# Patient Record
Sex: Male | Born: 1965 | Race: White | Hispanic: No | Smoking: Current every day smoker
Health system: Southern US, Community
[De-identification: ages and names within clinical notes are randomized; demographics above are authoritative.]

## PROBLEM LIST (undated history)

## (undated) DIAGNOSIS — B1920 Unspecified viral hepatitis C without hepatic coma: Secondary | ICD-10-CM

## (undated) DIAGNOSIS — IMO0002 Reserved for concepts with insufficient information to code with codable children: Secondary | ICD-10-CM

## (undated) HISTORY — PX: BACK SURGERY: SHX140

## (undated) HISTORY — PX: SPINAL FUSION: SHX223

---

## 2003-07-30 ENCOUNTER — Ambulatory Visit (HOSPITAL_COMMUNITY): Admission: RE | Admit: 2003-07-30 | Discharge: 2003-07-30 | Payer: Self-pay | Admitting: Pulmonary Disease

## 2003-08-17 ENCOUNTER — Emergency Department (HOSPITAL_COMMUNITY): Admission: EM | Admit: 2003-08-17 | Discharge: 2003-08-17 | Payer: Self-pay | Admitting: Emergency Medicine

## 2003-08-21 ENCOUNTER — Emergency Department (HOSPITAL_COMMUNITY): Admission: EM | Admit: 2003-08-21 | Discharge: 2003-08-21 | Payer: Self-pay | Admitting: Emergency Medicine

## 2004-06-05 ENCOUNTER — Emergency Department (HOSPITAL_COMMUNITY): Admission: EM | Admit: 2004-06-05 | Discharge: 2004-06-05 | Payer: Self-pay | Admitting: Emergency Medicine

## 2004-06-06 ENCOUNTER — Observation Stay (HOSPITAL_COMMUNITY): Admission: EM | Admit: 2004-06-06 | Discharge: 2004-06-07 | Payer: Self-pay | Admitting: Emergency Medicine

## 2004-11-18 ENCOUNTER — Emergency Department (HOSPITAL_COMMUNITY): Admission: EM | Admit: 2004-11-18 | Discharge: 2004-11-18 | Payer: Self-pay | Admitting: *Deleted

## 2008-05-23 ENCOUNTER — Emergency Department (HOSPITAL_COMMUNITY): Admission: EM | Admit: 2008-05-23 | Discharge: 2008-05-23 | Payer: Self-pay | Admitting: Emergency Medicine

## 2010-12-19 NOTE — Op Note (Signed)
Hector [name], Hector [name]               ACCOUNT NO.:  [id number]   MEDICAL RECORD NO.:  [id number]          PATIENT TYPE:  OBV   LOCATION:  A307                          FACILITY:  [facility]   PHYSICIAN:  Dalia Heading, M.D.  DATE OF BIRTH:  17-Jun-1966   DATE OF PROCEDURE:  06/06/2004  DATE OF DISCHARGE:  06/07/2004                                 OPERATIVE REPORT   PREOPERATIVE DIAGNOSIS:  Perineal/perirectal abscess.   POSTOPERATIVE DIAGNOSIS:  Perineal/perirectal abscess.   PROCEDURE:  Incision and drainage of perirectal abscess.   SURGEON:  Dalia Heading, M.D.   ANESTHESIA:  General.   INDICATIONS:  The patient is a 45 year old white male who presents with  worsening perirectal infection extending up the perineum to the base of the  scrotum.  Initial incision and drainage was performed in the emergency room,  but the patient's condition has worsened.  The patient now comes to the  operating room for incision and drainage of the perirectal abscess.  The  risks and benefits of the procedure were fully explained to the patient, who  gave informed consent.   DESCRIPTION OF PROCEDURE:  The patient was placed in the lithotomy position  after induction of general anesthesia.  The perineum was prepped and draped  using the usual sterile technique with Betadine.  Surgical site confirmation  was performed.   A longitudinal incision was made at the base of the scrotum extending down  into the perineum towards the anus.  Anaerobic and aerobic cultures were  taken.  Purulent fluid was obtained.  The wound was irrigated with normal  saline.  Any necrotic tissue was debrided.  Betadine impregnated packing was  then placed into the wound.  No connection to the rectum was noted.   All tape and needle counts were correct at the end of the procedure.  The  patient was awakened and transferred to PACU in stable condition.   COMPLICATIONS:  None.   SPECIMEN:  Cultures.   BLOOD LOSS:   MinimalLoraine Leriche   MAJ/MEDQ  D:  06/07/2004  T:  06/07/2004  Job:  [id number]   cc:   Dalia Heading, M.D.  [address], [place]  [place] [address]  Fax: [phone]   Oneal Deputy. Juanetta Gosling, M.D.  [address]  [place]  [place] [address]  Fax: [phone]

## 2011-04-13 ENCOUNTER — Ambulatory Visit: Payer: Self-pay | Admitting: Gastroenterology

## 2011-04-13 ENCOUNTER — Telehealth: Payer: Self-pay | Admitting: Gastroenterology

## 2011-04-14 NOTE — Telephone Encounter (Signed)
Routed to provider

## 2011-04-22 ENCOUNTER — Other Ambulatory Visit (HOSPITAL_COMMUNITY): Payer: Self-pay | Admitting: Physical Medicine and Rehabilitation

## 2011-04-22 DIAGNOSIS — M545 Low back pain: Secondary | ICD-10-CM

## 2011-04-22 DIAGNOSIS — M546 Pain in thoracic spine: Secondary | ICD-10-CM

## 2011-04-22 DIAGNOSIS — R209 Unspecified disturbances of skin sensation: Secondary | ICD-10-CM

## 2011-04-22 DIAGNOSIS — M961 Postlaminectomy syndrome, not elsewhere classified: Secondary | ICD-10-CM

## 2011-04-22 DIAGNOSIS — M79609 Pain in unspecified limb: Secondary | ICD-10-CM

## 2011-04-22 DIAGNOSIS — M47817 Spondylosis without myelopathy or radiculopathy, lumbosacral region: Secondary | ICD-10-CM

## 2011-04-29 ENCOUNTER — Ambulatory Visit (HOSPITAL_COMMUNITY)
Admission: RE | Admit: 2011-04-29 | Discharge: 2011-04-29 | Disposition: A | Discharge status: Home / Self Care | Payer: Medicare Other | Source: Ambulatory Visit | Attending: Physical Medicine and Rehabilitation | Admitting: Physical Medicine and Rehabilitation

## 2011-04-29 DIAGNOSIS — M961 Postlaminectomy syndrome, not elsewhere classified: Secondary | ICD-10-CM

## 2011-04-29 DIAGNOSIS — M79609 Pain in unspecified limb: Secondary | ICD-10-CM

## 2011-04-29 DIAGNOSIS — R209 Unspecified disturbances of skin sensation: Secondary | ICD-10-CM

## 2011-04-29 DIAGNOSIS — M546 Pain in thoracic spine: Secondary | ICD-10-CM

## 2011-04-29 DIAGNOSIS — M545 Low back pain: Secondary | ICD-10-CM

## 2011-04-29 DIAGNOSIS — M47817 Spondylosis without myelopathy or radiculopathy, lumbosacral region: Secondary | ICD-10-CM

## 2011-07-01 NOTE — Telephone Encounter (Signed)
First no-show. Please offer f/u office visit, may send letter.

## 2011-07-06 ENCOUNTER — Encounter: Payer: Self-pay | Admitting: Gastroenterology

## 2011-07-06 NOTE — Telephone Encounter (Signed)
Letter sent to patient.

## 2011-08-11 ENCOUNTER — Emergency Department (HOSPITAL_COMMUNITY)
Admission: EM | Admit: 2011-08-11 | Discharge: 2011-08-11 | Discharge status: Against Medical Advice | Payer: Medicare Other | Attending: Emergency Medicine | Admitting: Emergency Medicine

## 2011-08-11 ENCOUNTER — Encounter: Payer: Self-pay | Admitting: *Deleted

## 2011-08-11 DIAGNOSIS — Z0389 Encounter for observation for other suspected diseases and conditions ruled out: Secondary | ICD-10-CM | POA: Insufficient documentation

## 2011-08-11 DIAGNOSIS — Z5321 Procedure and treatment not carried out due to patient leaving prior to being seen by health care provider: Secondary | ICD-10-CM

## 2011-08-11 NOTE — ED Notes (Signed)
Pt lwbs 

## 2011-08-11 NOTE — ED Notes (Signed)
Abscess to lt forearm 

## 2013-01-09 ENCOUNTER — Encounter (HOSPITAL_COMMUNITY): Payer: Self-pay

## 2013-01-09 ENCOUNTER — Emergency Department (HOSPITAL_COMMUNITY)
Admission: EM | Admit: 2013-01-09 | Discharge: 2013-01-09 | Disposition: A | Discharge status: Home / Self Care | Payer: Medicare Other | Attending: Emergency Medicine | Admitting: Emergency Medicine

## 2013-01-09 DIAGNOSIS — M542 Cervicalgia: Secondary | ICD-10-CM | POA: Insufficient documentation

## 2013-01-09 DIAGNOSIS — F17200 Nicotine dependence, unspecified, uncomplicated: Secondary | ICD-10-CM | POA: Insufficient documentation

## 2013-01-09 DIAGNOSIS — G8929 Other chronic pain: Secondary | ICD-10-CM | POA: Insufficient documentation

## 2013-01-09 DIAGNOSIS — Z8619 Personal history of other infectious and parasitic diseases: Secondary | ICD-10-CM | POA: Insufficient documentation

## 2013-01-09 DIAGNOSIS — M549 Dorsalgia, unspecified: Secondary | ICD-10-CM | POA: Insufficient documentation

## 2013-01-09 DIAGNOSIS — Z8739 Personal history of other diseases of the musculoskeletal system and connective tissue: Secondary | ICD-10-CM | POA: Insufficient documentation

## 2013-01-09 HISTORY — DX: Reserved for concepts with insufficient information to code with codable children: IMO0002

## 2013-01-09 HISTORY — DX: Unspecified viral hepatitis C without hepatic coma: B19.20

## 2013-01-09 MED ORDER — ACETAMINOPHEN-CODEINE #3 300-30 MG PO TABS: 1.0000 | ORAL_TABLET | Freq: Four times a day (QID) | ORAL | Status: DC | PRN

## 2013-01-09 MED ORDER — METHOCARBAMOL 500 MG PO TABS: 500.0000 mg | ORAL_TABLET | Freq: Three times a day (TID) | ORAL | Status: DC

## 2013-01-09 MED ORDER — PREDNISONE 50 MG PO TABS
60.0000 mg | ORAL_TABLET | Freq: Once | ORAL | Status: AC
  Administered 2013-01-09: 60 mg via ORAL
  Filled 2013-01-09: qty 1

## 2013-01-09 MED ORDER — PREDNISONE 10 MG PO TABS: ORAL_TABLET | ORAL | Status: DC

## 2013-01-09 MED ORDER — METHOCARBAMOL 500 MG PO TABS
1000.0000 mg | ORAL_TABLET | Freq: Once | ORAL | Status: AC
  Administered 2013-01-09: 1000 mg via ORAL
  Filled 2013-01-09: qty 2

## 2013-01-09 MED ORDER — ACETAMINOPHEN-CODEINE #3 300-30 MG PO TABS
1.0000 | ORAL_TABLET | Freq: Once | ORAL | Status: AC
  Administered 2013-01-09: 1 via ORAL
  Filled 2013-01-09: qty 1

## 2013-01-09 MED ORDER — ONDANSETRON HCL 4 MG PO TABS
4.0000 mg | ORAL_TABLET | Freq: Once | ORAL | Status: AC
  Administered 2013-01-09: 4 mg via ORAL
  Filled 2013-01-09: qty 1

## 2013-01-09 NOTE — ED Provider Notes (Signed)
History     CSN: [id number]  Arrival date & time 01/09/13  1603   First MD Initiated Contact with Patient 01/09/13 1832      Chief Complaint  Patient presents with  . Back Pain  . Neck Pain    (Consider location/radiation/quality/duration/timing/severity/associated sxs/prior treatment) Patient is a 47 y.o. male presenting with back pain and neck pain. The history is provided by the patient.  Back Pain Location:  Thoracic spine Quality:  Aching Radiates to:  Does not radiate Pain severity:  Moderate Pain is:  Same all the time Onset quality:  Gradual Duration: 20 years. Timing:  Intermittent Progression:  Worsening Chronicity:  Chronic Context comment:  Hx of DDD of the neck and upper back. No recent injury. Relieved by:  Nothing Worsened by:  Nothing tried Ineffective treatments:  None tried Associated symptoms: no abdominal pain, no bladder incontinence, no bowel incontinence, no chest pain, no dysuria and no perianal numbness   Neck Pain Associated symptoms: no bladder incontinence, no bowel incontinence, no chest pain and no photophobia     Past Medical History  Diagnosis Date  . Degenerative disc disease   . Hepatitis C     Past Surgical History  Procedure Laterality Date  . Back surgery    . Spinal fusion      Family History  Problem Relation Age of Onset  . Diabetes Mother   . Diabetes Father     History  Substance Use Topics  . Smoking status: Current Every Day Smoker  . Smokeless tobacco: Not on file  . Alcohol Use: No      Review of Systems  Constitutional: Negative for activity change.       All ROS Neg except as noted in HPI  HENT: Positive for neck pain. Negative for nosebleeds.   Eyes: Negative for photophobia and discharge.  Respiratory: Negative for cough, shortness of breath and wheezing.   Cardiovascular: Negative for chest pain and palpitations.  Gastrointestinal: Negative for abdominal pain, blood in stool and bowel  incontinence.  Genitourinary: Negative for bladder incontinence, dysuria, frequency and hematuria.  Musculoskeletal: Positive for back pain. Negative for arthralgias.  Skin: Negative.   Neurological: Negative for dizziness, seizures and speech difficulty.  Psychiatric/Behavioral: Negative for hallucinations and confusion.    Allergies  Hydrocodone  Home Medications  No current outpatient prescriptions on file.  BP 164/82  Pulse 70  Temp(Src) 98.2 F (36.8 C) (Oral)  Resp 18  Ht 5\' 9"  (1.753 m)  Wt 200 lb (90.719 kg)  BMI 29.52 kg/m2  SpO2 100%  Physical Exam  Nursing note and vitals reviewed. Constitutional: He is oriented to person, place, and time. He appears well-developed and well-nourished.  Non-toxic appearance.  HENT:  Head: Normocephalic.  Right Ear: Tympanic membrane and external ear normal.  Left Ear: Tympanic membrane and external ear normal.  Eyes: EOM and lids are normal. Pupils are equal, round, and reactive to light.  Neck: Normal range of motion. Neck supple. Carotid bruit is not present.  Cardiovascular: Normal rate, regular rhythm, normal heart sounds, intact distal pulses and normal pulses.   Pulmonary/Chest: Breath sounds normal. No respiratory distress.  Abdominal: Soft. Bowel sounds are normal. There is no tenderness. There is no guarding.  Musculoskeletal: Normal range of motion.       Back:  Lymphadenopathy:       Head (right side): No submandibular adenopathy present.       Head (left side): No submandibular adenopathy present.  He has no cervical adenopathy.  Neurological: He is alert and oriented to person, place, and time. He has normal strength. No cranial nerve deficit or sensory deficit.  Skin: Skin is warm and dry.  Psychiatric: He has a normal mood and affect. His speech is normal.    ED Course  Procedures (including critical care time)  Labs Reviewed - No data to display No results found.   No diagnosis found.  Pulse ox 100%  on room air. WNL by my interpretation.  MDM  I have reviewed nursing notes, vital signs, and all appropriate lab and imaging results for this patient. Patient has history of degenerative disc disease of multiple sites. He has chronic neck and upper back area pain. He describes an acute flare of this chronic pain that started on last evening and continues today. Patient complains of pain with movement of the neck. The patient is not dropping any objects, there no gross neurologic deficits appreciated on examination today. The vital signs are stable with the exception of the blood pressure being elevated at 164/82. The gait is steady.  The plan at this time is for the patient to be treated with prednisone taper, Robaxin 3 times daily, Tylenol with codeine #3, 15 tablets. Referral to orthopedics for evaluation, as the patient has not seen an orthopedic specialist in over a year.  Kathie Dike, PA-C 01/09/13 1910

## 2013-01-09 NOTE — ED Notes (Signed)
Pt reports chronic neck and upper back pain.  Pt says pain has flared up today.

## 2013-01-09 NOTE — ED Notes (Signed)
Pain upper T spine and c spine , onset today, No recent injury, Says it hurts all the time, but is worse today.

## 2013-01-12 NOTE — ED Provider Notes (Signed)
Medical screening examination/treatment/procedure(s) were performed by non-physician practitioner and as supervising physician I was immediately available for consultation/collaboration.   Laray Anger, DO 01/12/13 [phone]

## 2013-07-02 ENCOUNTER — Emergency Department (HOSPITAL_COMMUNITY): Payer: Medicare Other

## 2013-07-02 ENCOUNTER — Emergency Department (HOSPITAL_COMMUNITY)
Admission: EM | Admit: 2013-07-02 | Discharge: 2013-07-03 | Disposition: A | Discharge status: Home / Self Care | Payer: Medicare Other | Attending: Emergency Medicine | Admitting: Emergency Medicine

## 2013-07-02 ENCOUNTER — Encounter (HOSPITAL_COMMUNITY): Payer: Self-pay | Admitting: Emergency Medicine

## 2013-07-02 DIAGNOSIS — F17200 Nicotine dependence, unspecified, uncomplicated: Secondary | ICD-10-CM | POA: Insufficient documentation

## 2013-07-02 DIAGNOSIS — Y939 Activity, unspecified: Secondary | ICD-10-CM | POA: Insufficient documentation

## 2013-07-02 DIAGNOSIS — W540XXA Bitten by dog, initial encounter: Secondary | ICD-10-CM | POA: Insufficient documentation

## 2013-07-02 DIAGNOSIS — Z8739 Personal history of other diseases of the musculoskeletal system and connective tissue: Secondary | ICD-10-CM | POA: Insufficient documentation

## 2013-07-02 DIAGNOSIS — S61209A Unspecified open wound of unspecified finger without damage to nail, initial encounter: Secondary | ICD-10-CM | POA: Insufficient documentation

## 2013-07-02 DIAGNOSIS — Y929 Unspecified place or not applicable: Secondary | ICD-10-CM | POA: Insufficient documentation

## 2013-07-02 DIAGNOSIS — Z203 Contact with and (suspected) exposure to rabies: Secondary | ICD-10-CM

## 2013-07-02 DIAGNOSIS — Z8619 Personal history of other infectious and parasitic diseases: Secondary | ICD-10-CM | POA: Insufficient documentation

## 2013-07-02 DIAGNOSIS — Z23 Encounter for immunization: Secondary | ICD-10-CM | POA: Insufficient documentation

## 2013-07-02 MED ORDER — OXYCODONE-ACETAMINOPHEN 5-325 MG PO TABS
2.0000 | ORAL_TABLET | Freq: Once | ORAL | Status: AC
  Administered 2013-07-02: 2 via ORAL
  Filled 2013-07-02: qty 2

## 2013-07-02 MED ORDER — TETANUS-DIPHTH-ACELL PERTUSSIS 5-2.5-18.5 LF-MCG/0.5 IM SUSP
0.5000 mL | Freq: Once | INTRAMUSCULAR | Status: AC
  Administered 2013-07-02: 0.5 mL via INTRAMUSCULAR
  Filled 2013-07-02: qty 0.5

## 2013-07-02 MED ORDER — RABIES VACCINE, PCEC IM SUSR
1.0000 mL | Freq: Once | INTRAMUSCULAR | Status: AC
  Administered 2013-07-03: 1 mL via INTRAMUSCULAR
  Filled 2013-07-02: qty 1

## 2013-07-02 MED ORDER — IBUPROFEN 800 MG PO TABS
800.0000 mg | ORAL_TABLET | Freq: Once | ORAL | Status: AC
  Administered 2013-07-02: 800 mg via ORAL
  Filled 2013-07-02: qty 1

## 2013-07-02 MED ORDER — LIDOCAINE HCL (PF) 1 % IJ SOLN
5.0000 mL | Freq: Once | INTRAMUSCULAR | Status: AC
  Administered 2013-07-03: 5 mL
  Filled 2013-07-02: qty 5

## 2013-07-02 MED ORDER — RABIES IMMUNE GLOBULIN 150 UNIT/ML IM INJ
20.0000 [IU]/kg | INJECTION | Freq: Once | INTRAMUSCULAR | Status: AC
  Administered 2013-07-03: 1800 [IU] via INTRAMUSCULAR
  Filled 2013-07-02: qty 12

## 2013-07-02 MED ORDER — RABIES IMMUNE GLOBULIN 150 UNIT/ML IM INJ
INJECTION | INTRAMUSCULAR | Status: AC
  Administered 2013-07-03: 1800 [IU] via INTRAMUSCULAR
  Filled 2013-07-02: qty 12

## 2013-07-02 NOTE — ED Provider Notes (Signed)
CSN: [id number]     Arrival date & time 07/02/13  2155 History  This chart was scribed for Sunnie Nielsen, MD by Blanchard Kelch, ED Scribe. The patient was seen in room [facility]. Patient's care was started at 11:04 PM.    Chief Complaint  Patient presents with  . Animal Bite   Patient is a 47 y.o. male presenting with animal bite. The history is provided by the patient. No language interpreter was used.  Animal Bite Contact animal:  Dog Location:  Finger and hand Hand injury location:  L hand Finger injury location:  R thumb Time since incident:  1 hour Pain details:    Quality:  Sharp   Severity:  Severe   Timing:  Constant Incident location:  Outside Provoked: unprovoked   Notifications:  Animal control and law enforcement Animal's rabies vaccination status:  Not immunized Animal in possession: no   Tetanus status:  Unknown Associated symptoms: no fever     HPI Comments: Hector [name] is a 47 y.o. male who presents to the Emergency Department complaining of unprovoked dog bites on his left hand and right thumb from a stray dog that occurred prior to arrival. he was walking down the road and the dog came out of bushes, bit him and then ran away. The animal control and police were notified but they do not have the dog in possession currently. He does not know if the dog has had his rabies vaccination. The bleeding is currently controlled.  He is complaining of constant, sharp pain to the affected area. The pain is a 10/10 in severity currently. He does not know if he is up to date on his Tetanus vaccination. He has a past medical history of degenerative disc disease and hepatitis C.   Past Medical History  Diagnosis Date  . Degenerative disc disease   . Hepatitis C    Past Surgical History  Procedure Laterality Date  . Back surgery    . Spinal fusion     Family History  Problem Relation Age of Onset  . Diabetes Mother   . Diabetes Father    History  Substance Use  Topics  . Smoking status: Current Every Day Smoker  . Smokeless tobacco: Not on file  . Alcohol Use: No    Review of Systems  Constitutional: Negative for fever.  Musculoskeletal: Positive for arthralgias.  Skin: Positive for wound.  All other systems reviewed and are negative.    Allergies  Hydrocodone  Home Medications  No current outpatient prescriptions on file. Triage Vitals: BP 149/79  Pulse 89  Temp(Src) 98 F (36.7 C) (Oral)  Resp 20  Ht 5\' 9"  (1.753 m)  Wt 200 lb (90.719 kg)  BMI 29.52 kg/m2  SpO2 98%  Physical Exam  Nursing note and vitals reviewed. Constitutional: He is oriented to person, place, and time. He appears well-developed and well-nourished.  HENT:  Head: Normocephalic and atraumatic.  Eyes: Conjunctivae and EOM are normal. Pupils are equal, round, and reactive to light.  Neck: Normal range of motion. Neck supple.  Cardiovascular: Normal rate, regular rhythm and intact distal pulses.   Pulmonary/Chest: Effort normal. No stridor. No respiratory distress.  Abdominal: Soft.  Musculoskeletal: Normal range of motion.  No tenderness over proximal left forearm.  Neurological: He is alert and oriented to person, place, and time.  Skin: Skin is warm and dry.  Right hand irregular laceration at base of thumb nail bed. Two linear wounds. No active bleeding. Tenderness over  area of IP joint. Slight subungual hematoma. Left wrist has multiple puncture wounds on dorsum of wrist. 2 cm laceration to thenar eminence. Assocaited swelling and tenderness. Distally neurovascularly intact. Good capillary refill.     ED Course  Nail bed trephination Date/Time: 07/03/2013 1:58 AM Performed by: Sunnie Nielsen Authorized by: Sunnie Nielsen Consent: Verbal consent obtained. Risks and benefits: risks, benefits and alternatives were discussed Consent given by: patient Patient understanding: patient states understanding of the procedure being performed Patient consent: the  patient's understanding of the procedure matches consent given Procedure consent: procedure consent matches procedure scheduled Required items: required blood products, implants, devices, and special equipment available Patient identity confirmed: verbally with patient Time out: Immediately prior to procedure a "time out" was called to verify the correct patient, procedure, equipment, support staff and site/side marked as required. Preparation: Patient was prepped and draped in the usual sterile fashion. Local anesthesia used: yes Anesthesia: local infiltration Local anesthetic: lidocaine 1% without epinephrine Anesthetic total: 4 ml Patient tolerance: Patient tolerated the procedure well with no immediate complications. Comments: Right thumb digital block performed.  Nail bed trephination with 18-gauge needle - subungual hematoma drained    (including critical care time)  DIAGNOSTIC STUDIES: Oxygen Saturation is 98% on room air, normal by my interpretation.    COORDINATION OF CARE: 11:03 PM -Will order wound care, and right thumb and left wrist x-rays. Had at length discussion about rabies and vaccination and I highly recommended getting the vaccination. He was worried about the pain associated with the shots because he believed they would be in his stomach but I explained the process to him. He was then agreeable to receiving the vaccination.  Patient verbalizes understanding and agrees with treatment plan.   Labs Review Labs Reviewed - No data to display Imaging Review Dg Wrist Complete Left  07/03/2013   CLINICAL DATA:  Alleged dog bite with pain and lacerations.  EXAM: LEFT WRIST - COMPLETE 3+ VIEW  COMPARISON:  None.  FINDINGS: Mild degenerate changes of the radiocarpal joint. No acute fracture or dislocation. No radiopaque foreign object. Scaphoid intact. Suspect soft tissue swelling primarily volarly.  IMPRESSION: No acute osseous abnormality.   Electronically Signed   By: Jeronimo Greaves M.D.   On: 07/03/2013 01:22   Dg Finger Thumb Right  07/03/2013   CLINICAL DATA:  Alleged dog bite.  Pain.  Laceration.  EXAM: RIGHT THUMB 2+V  COMPARISON:  None.  FINDINGS: Osseous irregularity about the radial aspect of the intercarpal joint of the thumb is favored to be degenerative or possibly remote posttraumatic. No donor site identified.  No acute fracture or dislocation. No soft tissue gas or radiopaque foreign object. Degenerate changes are seen about the interphalangeal joint of the thumb as well as the 2nd metacarpal phalangeal joint.  IMPRESSION: No acute osseous abnormality.   Electronically Signed   By: Jeronimo Greaves M.D.   On: 07/03/2013 01:23   Animal control notified  Augmentin provided Wounds cleaned and irrigated extensively Sterile dressings placed Percocet for pain control Patient states understanding strict return precautions/ infection precautions Plan recheck 48 hours Rabies immunoglobulin and vaccine initiated  Patient states understanding all discharge and followup instructions - he is able to afford prescription for Augmentin. Short course of Percocet provided MDM  Diagnosis: Dog bite right thumb, left wrist and hand  Evaluated with x-rays reviewed as above no bony injuries or foreign bodies Pain medications provided ABx provided Vital signs nurse's notes reviewed and considered  I personally  performed the services described in this documentation, which was scribed in my presence. The recorded information has been reviewed and is accurate.     Sunnie Nielsen, MD 07/03/13 [phone]

## 2013-07-02 NOTE — ED Notes (Addendum)
Patient reports stray dog bit both hands tonight. Patient unaware of who dog belonged to and has not reported incident. Puncture wounds to hands bilaterally and to lower left forearm. Bleeding controlled at this time.

## 2013-07-02 NOTE — ED Notes (Signed)
Patient states that a stray unknown dog bit him. Bite marks to the right thumb, left hand and left arm. Patient states that he went home and washed up and it would not quit bleeding so he came to the ER. Had patient wash his hand and arms at the sink in the room and pat dry with a towel. Patient informed that he may need Rabies shots. Patient states that he will get a tetanus shot but refuses to get the rabies shots.

## 2013-07-03 MED ORDER — OXYCODONE-ACETAMINOPHEN 5-325 MG PO TABS: 2.0000 | ORAL_TABLET | ORAL | Status: DC | PRN

## 2013-07-03 MED ORDER — AMOXICILLIN-POT CLAVULANATE 875-125 MG PO TABS: 1.0000 | ORAL_TABLET | Freq: Two times a day (BID) | ORAL | Status: DC

## 2013-07-03 MED ORDER — AMOXICILLIN-POT CLAVULANATE 875-125 MG PO TABS
1.0000 | ORAL_TABLET | Freq: Once | ORAL | Status: AC
  Administered 2013-07-03: 1 via ORAL
  Filled 2013-07-03: qty 1

## 2014-03-11 ENCOUNTER — Emergency Department (HOSPITAL_COMMUNITY)
Admission: EM | Admit: 2014-03-11 | Discharge: 2014-03-11 | Disposition: A | Discharge status: Home / Self Care | Payer: Medicare Other | Attending: Emergency Medicine | Admitting: Emergency Medicine

## 2014-03-11 ENCOUNTER — Emergency Department (HOSPITAL_COMMUNITY): Payer: Medicare Other

## 2014-03-11 ENCOUNTER — Encounter (HOSPITAL_COMMUNITY): Payer: Self-pay | Admitting: Emergency Medicine

## 2014-03-11 DIAGNOSIS — Y9289 Other specified places as the place of occurrence of the external cause: Secondary | ICD-10-CM | POA: Insufficient documentation

## 2014-03-11 DIAGNOSIS — Y9389 Activity, other specified: Secondary | ICD-10-CM | POA: Diagnosis not present

## 2014-03-11 DIAGNOSIS — W010XXA Fall on same level from slipping, tripping and stumbling without subsequent striking against object, initial encounter: Secondary | ICD-10-CM | POA: Insufficient documentation

## 2014-03-11 DIAGNOSIS — IMO0002 Reserved for concepts with insufficient information to code with codable children: Secondary | ICD-10-CM | POA: Insufficient documentation

## 2014-03-11 DIAGNOSIS — Z8619 Personal history of other infectious and parasitic diseases: Secondary | ICD-10-CM | POA: Insufficient documentation

## 2014-03-11 DIAGNOSIS — S335XXA Sprain of ligaments of lumbar spine, initial encounter: Secondary | ICD-10-CM | POA: Insufficient documentation

## 2014-03-11 DIAGNOSIS — Z8739 Personal history of other diseases of the musculoskeletal system and connective tissue: Secondary | ICD-10-CM | POA: Diagnosis not present

## 2014-03-11 DIAGNOSIS — F17200 Nicotine dependence, unspecified, uncomplicated: Secondary | ICD-10-CM | POA: Diagnosis not present

## 2014-03-11 DIAGNOSIS — S39012A Strain of muscle, fascia and tendon of lower back, initial encounter: Secondary | ICD-10-CM

## 2014-03-11 DIAGNOSIS — Z9889 Other specified postprocedural states: Secondary | ICD-10-CM | POA: Diagnosis not present

## 2014-03-11 MED ORDER — HYDROMORPHONE HCL PF 1 MG/ML IJ SOLN
1.0000 mg | Freq: Once | INTRAMUSCULAR | Status: AC
  Administered 2014-03-11: 1 mg via INTRAMUSCULAR
  Filled 2014-03-11: qty 1

## 2014-03-11 MED ORDER — IBUPROFEN 600 MG PO TABS: 600.0000 mg | ORAL_TABLET | Freq: Four times a day (QID) | ORAL | Status: AC | PRN

## 2014-03-11 MED ORDER — OXYCODONE-ACETAMINOPHEN 5-325 MG PO TABS: 1.0000 | ORAL_TABLET | ORAL | Status: AC | PRN

## 2014-03-11 NOTE — ED Notes (Signed)
PT fell this morning onto right side and c/o lower back pain. PT has hx of back surgery.

## 2014-03-11 NOTE — ED Notes (Signed)
NAD noted at time of d/c.

## 2014-03-11 NOTE — Discharge Instructions (Signed)
Lumbosacral Strain Lumbosacral strain is a strain of any of the parts that make up your lumbosacral vertebrae. Your lumbosacral vertebrae are the bones that make up the lower third of your backbone. Your lumbosacral vertebrae are held together by muscles and tough, fibrous tissue (ligaments).  CAUSES  A sudden blow to your back can cause lumbosacral strain. Also, anything that causes an excessive stretch of the muscles in the low back can cause this strain. This is typically seen when people exert themselves strenuously, fall, lift heavy objects, bend, or crouch repeatedly. RISK FACTORS  Physically demanding work.  Participation in pushing or pulling sports or sports that require a sudden twist of the back (tennis, golf, baseball).  Weight lifting.  Excessive lower back curvature.  Forward-tilted pelvis.  Weak back or abdominal muscles or both.  Tight hamstrings. SIGNS AND SYMPTOMS  Lumbosacral strain may cause pain in the area of your injury or pain that moves (radiates) down your leg.  DIAGNOSIS Your health care provider can often diagnose lumbosacral strain through a physical exam. In some cases, you may need tests such as X-ray exams.  TREATMENT  Treatment for your lower back injury depends on many factors that your clinician will have to evaluate. However, most treatment will include the use of anti-inflammatory medicines. HOME CARE INSTRUCTIONS   Avoid hard physical activities (tennis, racquetball, waterskiing) if you are not in proper physical condition for it. This may aggravate or create problems.  If you have a back problem, avoid sports requiring sudden body movements. Swimming and walking are generally safer activities.  Maintain good posture.  Maintain a healthy weight.  For acute conditions, you may put ice on the injured area.  Put ice in a plastic bag.  Place a towel between your skin and the bag.  Leave the ice on for 20 minutes, 2-3 times a day.  When the  low back starts healing, stretching and strengthening exercises may be recommended. SEEK MEDICAL CARE IF:  Your back pain is getting worse.  You experience severe back pain not relieved with medicines. SEEK IMMEDIATE MEDICAL CARE IF:   You have numbness, tingling, weakness, or problems with the use of your arms or legs.  There is a change in bowel or bladder control.  You have increasing pain in any area of the body, including your belly (abdomen).  You notice shortness of breath, dizziness, or feel faint.  You feel sick to your stomach (nauseous), are throwing up (vomiting), or become sweaty.  You notice discoloration of your toes or legs, or your feet get very cold. MAKE SURE YOU:   Understand these instructions.  Will watch your condition.  Will get help right away if you are not doing well or get worse. Document Released: 04/29/2005 Document Revised: 07/25/2013 Document Reviewed: 03/08/2013 [facility] Patient Information 2015 [employer], [place]. This information is not intended to replace advice given to you by your health care provider. Make sure you discuss any questions you have with your health care provider.      Do not drive within 4 hours of taking oxycodone as this will make you drowsy.  Avoid lifting,  Bending,  Twisting or any other activity that worsens your pain over the next week.  Apply an  icepack  to your lower back for 10-15 minutes every 2 hours for the next 2 days if this does not make your pain worse.  You may then start using a heating pad on Tuesday 20 minutes several times daily.  You  should get rechecked if your symptoms are not better over the next 5 days,  Or you develop increased pain,  Weakness in your leg(s) or loss of bladder or bowel function - these are symptoms of a worse injury.  Your xrays are negative for acute injury today.

## 2014-03-11 NOTE — ED Provider Notes (Signed)
CSN: [id number]     Arrival date & time 03/11/14  1358 History  This chart was scribed for non-physician practitioner, Burgess AmorJulie Rachele Lamaster, PA-C,working with Vanetta MuldersScott Zackowski, MD by Karle PlumberJennifer Tensley, ED Scribe. This patient was seen in room APFT23/APFT23 and the patient's care was started at 3:36 PM.  Chief Complaint  Patient presents with  . Back Pain   Patient is a 48 y.o. male presenting with back pain. The history is provided by the patient. No language interpreter was used.  Back Pain Associated symptoms: no abdominal pain, no chest pain, no dysuria, no fever, no numbness and no weakness    HPI Comments:  Hector [name] is a 48 y.o. male with h/o spinal fusion who presents to the Emergency Department complaining of severe lower back pain that radiates down BLE secondary to tripping over his dog and falling a few hours ago. He reports some mild numbness to his left lateral lower leg since the trip, denies weakness. He states he fell on his forearms but denies pain or injury to the upper extremities. He denies taking any medications for the pain. He denies bowel or bladder incontinence or retention,  weakness of the lower extremities.  Pt states [place] back [place] was in [place] in [place]. He reports allergy to Hydrocodone with a reaction of itching.  Past Medical History  Diagnosis Date  . Degenerative disc disease   . Hepatitis C    Past Surgical History  Procedure Laterality Date  . Back surgery    . Spinal fusion     Family History  Problem Relation Age of Onset  . Diabetes Mother   . Diabetes Father    History  Substance Use Topics  . Smoking status: Current Every Day Smoker -- 0.50 packs/day    Types: Cigarettes  . Smokeless tobacco: Not on file  . Alcohol Use: No    Review of Systems  Constitutional: Negative for fever.  Respiratory: Negative for shortness of breath.   Cardiovascular: Negative for chest pain and leg swelling.  Gastrointestinal: Negative for abdominal pain,  constipation and abdominal distention.  Genitourinary: Negative for dysuria, urgency, frequency, flank pain and difficulty urinating.  Musculoskeletal: Positive for back pain. Negative for gait problem and joint swelling.  Skin: Negative for rash.  Neurological: Negative for weakness and numbness.    Allergies  Hydrocodone  Home Medications   Prior to Admission medications   Medication Sig Start Date End Date Taking? Authorizing Provider  ibuprofen (ADVIL,MOTRIN) 600 MG tablet Take 1 tablet (600 mg total) by mouth every 6 (six) hours as needed. 03/11/14   Burgess AmorJulie Annaleia Pence, PA-C  oxyCODONE-acetaminophen (PERCOCET/ROXICET) 5-325 MG per tablet Take 1 tablet by mouth every 4 (four) hours as needed. 03/11/14   Burgess AmorJulie Della Scrivener, PA-C   Triage Vitals: BP 158/101  Pulse 82  Temp(Src) 98 F (36.7 C) (Oral)  Resp 18  Ht 5\' 9"  (1.753 m)  Wt 185 lb (83.915 kg)  BMI 27.31 kg/m2  SpO2 100% Physical Exam  Nursing note and vitals reviewed. Constitutional: He appears well-developed and well-nourished.  HENT:  Head: Normocephalic.  Eyes: Conjunctivae are normal.  Neck: Normal range of motion. Neck supple.  Cardiovascular: Normal rate and intact distal pulses.   Pedal pulses normal.  Pulmonary/Chest: Effort normal.  Abdominal: Soft. Bowel sounds are normal. He exhibits no distension and no mass.  Musculoskeletal: Normal range of motion. He exhibits no edema.       Lumbar back: He exhibits tenderness. He exhibits no swelling, no  edema and no spasm.  Positive SLR of left leg.  Neurological: He is alert. He has normal strength. He displays no atrophy and no tremor. No sensory deficit. Gait normal.  Reflex Scores:      Patellar reflexes are 2+ on the right side and 2+ on the left side.      Achilles reflexes are 2+ on the right side and 2+ on the left side. No strength deficit noted in hip and knee flexor and extensor muscle groups.  Ankle flexion and extension intact.  Skin: Skin is warm and dry.   Psychiatric: He has a normal mood and affect.    ED Course  Procedures (including critical care time) DIAGNOSTIC STUDIES: Oxygen Saturation is 100% on RA, normal by my interpretation.   COORDINATION OF CARE: 3:42 PM- Will give pain medication prior to discharge. Will prescribe pain medication and refer to orthopedics. Pt verbalizes understanding and agrees to plan.  Medications  HYDROmorphone (DILAUDID) injection 1 mg (1 mg Intramuscular Given 03/11/14 1609)    Labs Review Labs Reviewed - No data to display  Imaging Review Dg Lumbar Spine Complete  03/11/2014   CLINICAL DATA:  Low back pain, fall  EXAM: LUMBAR SPINE - COMPLETE 4+ VIEW  COMPARISON:  None.  FINDINGS: Five lumbar type vertebral bodies.  Normal lumbar lordosis.  No evidence of fracture dislocation.  Body heights are maintained.  Moderate multilevel degenerative changes.  Prior PLIF with pedicle screws at L4, L5, and S2.  Visualized bony pelvis appears intact.  IMPRESSION: No fracture or dislocation is seen.  Moderate multilevel degenerative changes.  Postsurgical changes, as above.   Electronically Signed   By: Charline Bills M.D.   On: 03/11/2014 15:35     EKG Interpretation None      MDM   Final diagnoses:  Lumbar strain, initial encounter    Dilaudid IM injection given here (son driving home). He was prescribed oxycodone, ibuprofen. Patients labs and/or radiological studies were viewed and considered during the medical decision making and disposition process. Encouraged rest,  Ice tx x 2 days, can add heat on day 3.  F/u with ortho for recheck if sx persist or worsen.  No neuro deficit on exam or by history to suggest emergent or surgical presentation.  Also discussed worsened sx that should prompt immediate re-evaluation including distal weakness, bowel/bladder retention/incontinence.   I personally performed the services described in this documentation, which was scribed in my presence. The recorded  information has been reviewed and is accurate.    Burgess Amor, PA-C 03/11/14 2154

## 2014-03-12 NOTE — ED Provider Notes (Signed)
Medical screening examination/treatment/procedure(s) were performed by non-physician practitioner and as supervising physician I was immediately available for consultation/collaboration.   EKG Interpretation None        Dyane Broberg, MD 03/12/14 0016 

## 2014-06-06 ENCOUNTER — Encounter (INDEPENDENT_AMBULATORY_CARE_PROVIDER_SITE_OTHER): Payer: Self-pay | Admitting: *Deleted

## 2014-07-02 ENCOUNTER — Encounter (INDEPENDENT_AMBULATORY_CARE_PROVIDER_SITE_OTHER): Payer: Self-pay | Admitting: *Deleted

## 2014-07-02 ENCOUNTER — Ambulatory Visit (INDEPENDENT_AMBULATORY_CARE_PROVIDER_SITE_OTHER): Payer: Medicare Other | Admitting: Internal Medicine

## 2014-07-02 ENCOUNTER — Encounter (INDEPENDENT_AMBULATORY_CARE_PROVIDER_SITE_OTHER): Payer: Self-pay | Admitting: Internal Medicine

## 2014-07-02 VITALS — BP 154/78 | HR 80 | Temp 97.7°F | Ht 69.0 in | Wt 194.9 lb

## 2014-07-02 DIAGNOSIS — B182 Chronic viral hepatitis C: Secondary | ICD-10-CM

## 2014-07-02 DIAGNOSIS — J441 Chronic obstructive pulmonary disease with (acute) exacerbation: Secondary | ICD-10-CM

## 2014-07-02 DIAGNOSIS — Z1159 Encounter for screening for other viral diseases: Secondary | ICD-10-CM

## 2014-07-02 DIAGNOSIS — B1920 Unspecified viral hepatitis C without hepatic coma: Secondary | ICD-10-CM

## 2014-07-02 NOTE — Patient Instructions (Signed)
Labs. Will need an  Biopsy [place]

## 2014-07-02 NOTE — Addendum Note (Signed)
Addended by: Len BlalockSETZER, Paislea Hatton L on: 07/02/2014 01:24 PM   Modules accepted: Orders

## 2014-07-02 NOTE — Progress Notes (Signed)
   Subjective:    Patient ID: Hector [name], male    DOB: 12/03/1965, 48 y.o.   MRN: [id number]  HPI Referred to our office by Dr. Janna [name] for Hepatitis C.  He says he was diagnosed in [address]1990 with Hepatitis [place] in [place]. He did not receive treatment. No hx of IV drug use. He has multiple tattoos. (greater than 20). Appetite is good. No weight loss.  Usually has a BM once a day. No melena or BRRB. No suicidal thoughts,.  [facility] [id number]    Review of Systems Past Medical History  Diagnosis Date  . Degenerative disc disease   . Hepatitis C     Past Surgical History  Procedure Laterality Date  . Back surgery    . Spinal fusion      Allergies  Allergen Reactions  . Hydrocodone Itching    Current Outpatient Prescriptions on File Prior to Visit  Medication Sig Dispense Refill  . ibuprofen (ADVIL,MOTRIN) 600 MG tablet Take 1 tablet (600 mg total) by mouth every 6 (six) hours as needed. 30 tablet 0  . oxyCODONE-acetaminophen (PERCOCET/ROXICET) 5-325 MG per tablet Take 1 tablet by mouth every 4 (four) hours as needed. 20 tablet 0   No current facility-administered medications on file prior to visit.        Objective:   Physical Exam  Filed Vitals:   07/02/14 1100  Height: 5\' 9"  (1.753 m)  Weight: 194 lb 14.4 oz (88.406 kg)   Alert and oriented. Skin warm and dry. Oral mucosa is moist.   . Sclera anicteric, conjunctivae is pink. Thyroid not enlarged. No cervical lymphadenopathy. Lungs clear. Heart regular rate and rhythm.  Abdomen is soft. Bowel sounds are positive. No hepatomegaly. No abdominal masses felt. No tenderness.  No edema to lower extremities.          Assessment & Plan:  Hepatitis C.  Hep C genotype. Hep C quaint, Liver profile, , CBC with diff.   AFP Liver biopsy after labs are back.

## 2014-07-07 LAB — HEPATIC FUNCTION PANEL
ALT: 22 U/L (ref 0–53)
AST: 25 U/L (ref 0–37)
Albumin: 4 g/dL (ref 3.5–5.2)
Alkaline Phosphatase: 94 U/L (ref 39–117)
Bilirubin, Direct: 0.2 mg/dL (ref 0.0–0.3)
Indirect Bilirubin: 0.5 mg/dL (ref 0.2–1.2)
Total Bilirubin: 0.7 mg/dL (ref 0.2–1.2)
Total Protein: 7.3 g/dL (ref 6.0–8.3)

## 2014-07-07 LAB — CBC WITH DIFFERENTIAL/PLATELET
Basophils Absolute: 0 10*3/uL (ref 0.0–0.1)
Basophils Relative: 0 % (ref 0–1)
Eosinophils Absolute: 0.1 10*3/uL (ref 0.0–0.7)
Eosinophils Relative: 1 % (ref 0–5)
HCT: 44.7 % (ref 39.0–52.0)
Hemoglobin: 15.8 g/dL (ref 13.0–17.0)
Lymphocytes Relative: 23 % (ref 12–46)
Lymphs Abs: 1.6 10*3/uL (ref 0.7–4.0)
MCH: 28.5 pg (ref 26.0–34.0)
MCHC: 35.3 g/dL (ref 30.0–36.0)
MCV: 80.7 fL (ref 78.0–100.0)
MPV: 11.9 fL (ref 9.4–12.4)
Monocytes Absolute: 0.6 10*3/uL (ref 0.1–1.0)
Monocytes Relative: 9 % (ref 3–12)
Neutro Abs: 4.6 10*3/uL (ref 1.7–7.7)
Neutrophils Relative %: 67 % (ref 43–77)
Platelets: 126 10*3/uL — ABNORMAL LOW (ref 150–400)
RBC: 5.54 MIL/uL (ref 4.22–5.81)
RDW: 13.3 % (ref 11.5–15.5)
WBC: 6.8 10*3/uL (ref 4.0–10.5)

## 2014-07-07 LAB — AFP TUMOR MARKER: AFP-Tumor Marker: 1.9 ng/mL (ref <6.1)

## 2014-07-09 ENCOUNTER — Other Ambulatory Visit (HOSPITAL_COMMUNITY): Payer: Medicare Other

## 2014-07-09 LAB — HEPATITIS C RNA QUANTITATIVE
HCV Quantitative Log: 6.17 {Log} — ABNORMAL HIGH (ref <1.18)
HCV Quantitative: 1463925 IU/mL — ABNORMAL HIGH (ref <15)

## 2014-07-11 LAB — HEPATITIS C GENOTYPE

## 2014-07-12 ENCOUNTER — Telehealth (INDEPENDENT_AMBULATORY_CARE_PROVIDER_SITE_OTHER): Payer: Self-pay | Admitting: Internal Medicine

## 2014-07-12 NOTE — Telephone Encounter (Signed)
Spoke with Blanca Friendimothy Ucci Jr. (Son) at [phone] and he said he will get his father to return the call.

## 2014-07-12 NOTE — Telephone Encounter (Signed)
Lupita LeashDonna, I need a phone number for this patient so I can give him his results. The one listed is wrong.

## 2014-07-16 ENCOUNTER — Other Ambulatory Visit (INDEPENDENT_AMBULATORY_CARE_PROVIDER_SITE_OTHER): Payer: Self-pay | Admitting: Internal Medicine

## 2014-07-16 DIAGNOSIS — B1920 Unspecified viral hepatitis C without hepatic coma: Secondary | ICD-10-CM

## 2014-07-16 NOTE — Telephone Encounter (Signed)
Phone is out of minutes. Go Mr. Valentina LucksStout on his son's phone and transferred it to [place].

## 2014-08-02 ENCOUNTER — Encounter (INDEPENDENT_AMBULATORY_CARE_PROVIDER_SITE_OTHER): Payer: Self-pay

## 2014-08-05 ENCOUNTER — Other Ambulatory Visit: Payer: Self-pay | Admitting: Radiology

## 2014-08-07 ENCOUNTER — Other Ambulatory Visit (HOSPITAL_COMMUNITY): Payer: Self-pay | Admitting: Interventional Radiology

## 2014-08-07 ENCOUNTER — Ambulatory Visit (HOSPITAL_COMMUNITY)
Admission: RE | Admit: 2014-08-07 | Discharge: 2014-08-07 | Disposition: A | Discharge status: Home / Self Care | Payer: Medicare Other | Source: Ambulatory Visit | Attending: Internal Medicine | Admitting: Internal Medicine

## 2014-08-07 ENCOUNTER — Encounter (HOSPITAL_COMMUNITY): Payer: Self-pay

## 2014-08-07 DIAGNOSIS — F17210 Nicotine dependence, cigarettes, uncomplicated: Secondary | ICD-10-CM | POA: Insufficient documentation

## 2014-08-07 DIAGNOSIS — B1920 Unspecified viral hepatitis C without hepatic coma: Secondary | ICD-10-CM | POA: Insufficient documentation

## 2014-08-07 DIAGNOSIS — Z79891 Long term (current) use of opiate analgesic: Secondary | ICD-10-CM | POA: Diagnosis not present

## 2014-08-07 DIAGNOSIS — Z6828 Body mass index (BMI) 28.0-28.9, adult: Secondary | ICD-10-CM | POA: Diagnosis not present

## 2014-08-07 DIAGNOSIS — Z981 Arthrodesis status: Secondary | ICD-10-CM | POA: Diagnosis not present

## 2014-08-07 LAB — CBC
HCT: 45.4 % (ref 39.0–52.0)
Hemoglobin: 15.7 g/dL (ref 13.0–17.0)
MCH: 28.3 pg (ref 26.0–34.0)
MCHC: 34.6 g/dL (ref 30.0–36.0)
MCV: 81.8 fL (ref 78.0–100.0)
Platelets: 117 10*3/uL — ABNORMAL LOW (ref 150–400)
RBC: 5.55 MIL/uL (ref 4.22–5.81)
RDW: 13.1 % (ref 11.5–15.5)
WBC: 7.3 10*3/uL (ref 4.0–10.5)

## 2014-08-07 LAB — APTT: aPTT: 33 seconds (ref 24–37)

## 2014-08-07 LAB — PROTIME-INR
INR: 1.02 (ref 0.00–1.49)
Prothrombin Time: 13.5 seconds (ref 11.6–15.2)

## 2014-08-07 MED ORDER — SODIUM CHLORIDE 0.9 % IV SOLN: INTRAVENOUS | Status: DC

## 2014-08-07 MED ORDER — MIDAZOLAM HCL 2 MG/2ML IJ SOLN
INTRAMUSCULAR | Status: AC | PRN
  Administered 2014-08-07 (×2): 1 mg via INTRAVENOUS

## 2014-08-07 MED ORDER — FENTANYL CITRATE 0.05 MG/ML IJ SOLN
INTRAMUSCULAR | Status: AC
  Filled 2014-08-07: qty 4

## 2014-08-07 MED ORDER — MIDAZOLAM HCL 2 MG/2ML IJ SOLN
INTRAMUSCULAR | Status: AC
  Filled 2014-08-07: qty 4

## 2014-08-07 MED ORDER — LIDOCAINE HCL (PF) 1 % IJ SOLN
INTRAMUSCULAR | Status: AC
  Filled 2014-08-07: qty 10

## 2014-08-07 MED ORDER — OXYCODONE-ACETAMINOPHEN 5-325 MG PO TABS
ORAL_TABLET | ORAL | Status: AC
  Filled 2014-08-07: qty 1

## 2014-08-07 MED ORDER — FENTANYL CITRATE 0.05 MG/ML IJ SOLN
INTRAMUSCULAR | Status: AC | PRN
Start: 2014-08-07 — End: 2014-08-07
  Administered 2014-08-07 (×2): 50 ug via INTRAVENOUS

## 2014-08-07 MED ORDER — OXYCODONE-ACETAMINOPHEN 5-325 MG PO TABS
1.0000 | ORAL_TABLET | Freq: Once | ORAL | Status: AC
  Administered 2014-08-07: 1 via ORAL

## 2014-08-07 NOTE — H&P (Signed)
Chief Complaint: Hep C  Referring Physician(s): Setzer,Terri L  History of Present Illness: Hector [name] is a 49 y.o. male  Pt dx with Hepatitis C 1989 Followed with PMD Now referred to Liver clinic for possible new treatment Scheduled for biopsy  Past Medical History  Diagnosis Date  . Degenerative disc disease   . Hepatitis C     Past Surgical History  Procedure Laterality Date  . Back surgery    . Spinal fusion      Allergies: Hydrocodone  Medications: Prior to Admission medications   Medication Sig Start Date End Date Taking? Authorizing Provider  OxyCODONE (OXYCONTIN) 10 mg T12A 12 hr tablet Take 10 mg by mouth 4 (four) times daily.   Yes Historical Provider, MD  ibuprofen (ADVIL,MOTRIN) 600 MG tablet Take 1 tablet (600 mg total) by mouth every 6 (six) hours as needed. 03/11/14   Burgess Amor, PA-C  oxyCODONE-acetaminophen (PERCOCET/ROXICET) 5-325 MG per tablet Take 1 tablet by mouth every 4 (four) hours as needed. 03/11/14   Burgess Amor, PA-C    Family History  Problem Relation Age of Onset  . Diabetes Mother   . Diabetes Father     History   Social History  . Marital Status: Divorced    Spouse Name: N/A    Number of Children: N/A  . Years of Education: N/A   Social History Main Topics  . Smoking status: Current Every Day Smoker -- 0.50 packs/day    Types: Cigarettes  . Smokeless tobacco: None     Comment: 1/2-3/4 pack a day x 30 yrs  . Alcohol Use: No  . Drug Use: No  . Sexual Activity: None   Other Topics Concern  . None   Social History Narrative     Review of Systems: A 12 point ROS discussed and pertinent positives are indicated in the HPI above.  All other systems are negative.  Review of Systems  Constitutional: Negative for activity change and fatigue.  Respiratory: Negative for cough and shortness of breath.   Cardiovascular: Negative for chest pain.  Gastrointestinal: Negative for abdominal pain and abdominal distention.    Musculoskeletal: Positive for back pain.  Neurological: Negative for weakness.  Psychiatric/Behavioral: Negative for behavioral problems and confusion.    Vital Signs: BP 170/99 mmHg  Pulse 72  Temp(Src) 97.7 F (36.5 C) (Oral)  Resp 20  Ht  (1.753 m)  Wt 86.183 kg (190 lb)  BMI 28.05 kg/m2  SpO2 95%  Physical Exam  Constitutional: He is oriented to person, place, and time.  Cardiovascular: Normal rate and regular rhythm.   No murmur heard. Pulmonary/Chest: Breath sounds normal. He has no wheezes.  Abdominal: Soft. Bowel sounds are normal. He exhibits no distension. There is no tenderness.  Musculoskeletal: Normal range of motion. He exhibits tenderness.  Neurological: He is alert and oriented to person, place, and time.  Skin: Skin is warm and dry.  Psychiatric: He has a normal mood and affect. His behavior is normal. Judgment and thought content normal.  Nursing note and vitals reviewed.   Imaging: No results found.  Labs:  CBC:  Recent Labs  07/06/14 1448 08/07/14 0859  WBC 6.8 7.3  HGB 15.8 15.7  HCT 44.7 45.4  PLT 126* 117*    COAGS:  Recent Labs  08/07/14 0859  INR 1.02  APTT 33    BMP: No results for input(s): NA, K, CL, CO2, GLUCOSE, BUN, CALCIUM, CREATININE, GFRNONAA, GFRAA in the last 8760 hours.  Invalid input(s): CMP  LIVER FUNCTION TESTS:  Recent Labs  07/06/14 1448  BILITOT 0.7  AST 25  ALT 22  ALKPHOS 94  PROT 7.3  ALBUMIN 4.0    TUMOR MARKERS:  Recent Labs  07/06/14 1445  AFPTM 1.9    Assessment and Plan:  Hep C Dx 1989 Now for possible new tx Scheduled for liver core bx Pt aware of procedure benefits and risks and agreeable to proceed Consent signed andin chart  Thank you for this interesting consult.  I greatly enjoyed meeting Hector [name] and look forward to participating in their care.    I spent a total of 20 minutes face to face in clinical consultation, greater than 50% of which was  counseling/coordinating care for liver core bx  Signed: Sloka Volante A 08/07/2014, 10:42 AM

## 2014-08-07 NOTE — Procedures (Signed)
Random liver Biopsy 18 g core times three No comp

## 2014-08-07 NOTE — Sedation Documentation (Signed)
Area of biopsy patient feels the beating of his heart. Back pain is 4/10. Was 8-9/10 pre procedure

## 2014-08-07 NOTE — Discharge Instructions (Signed)
Liver Biopsy, Care After °Refer to this sheet in the next few weeks. These instructions provide you with information on caring for yourself after your procedure. Your health care provider may also give you more specific instructions. Your treatment has been planned according to current medical practices, but problems sometimes occur. Call your health care provider if you have any problems or questions after your procedure. °WHAT TO EXPECT AFTER THE PROCEDURE °After your procedure, it is typical to have the following: °· A small amount of discomfort in the area where the biopsy was done and in the right shoulder or shoulder blade. °· A small amount of bruising around the area where the biopsy was done and on the skin over the liver. °· Sleepiness and fatigue for the rest of the day. °HOME CARE INSTRUCTIONS  °· Rest at home for 1-2 days or as directed by your health care provider. °· Have a friend or family member stay with you for at least 24 hours. °· Because of the medicines used during the procedure, you should not do the following things in the first 24 hours: °¨ Drive. °¨ Use machinery. °¨ Be responsible for the care of other people. °¨ Sign legal documents. °¨ Take a bath or shower. °· There are many different ways to close and cover an incision, including stitches, skin glue, and adhesive strips. Follow your health care provider's instructions on: °¨ Incision care. °¨ Bandage (dressing) changes and removal. °¨ Incision closure removal. °· Do not drink alcohol in the first week. °· Do not lift more than 5 pounds or play contact sports for 2 weeks after this test. °· Take medicines only as directed by your health care provider. Do not take medicine containing aspirin or non-steroidal anti-inflammatory medicines such as ibuprofen for 1 week after this test. °· It is your responsibility to get your test results. °SEEK MEDICAL CARE IF:  °· You have increased bleeding from an incision that results in more than a  small spot of blood. °· You have redness, swelling, or increasing pain in any incisions. °· You notice a discharge or a bad smell coming from any of your incisions. °· You have a fever or chills. °SEEK IMMEDIATE MEDICAL CARE IF:  °· You develop swelling, bloating, or pain in your abdomen. °· You become dizzy or faint. °· You develop a rash. °· You are nauseous or vomit. °· You have difficulty breathing, feel short of breath, or feel faint. °· You develop chest pain. °· You have problems with your speech or vision. °· You have trouble balancing or moving your arms or legs. °Document Released: 02/06/2005 Document Revised: 12/04/2013 Document Reviewed: 09/15/2013 °ExitCare® Patient Information ©2015 [employer]. This information is not intended to replace advice given to you by your health care provider. Make sure you discuss any questions you have with your health care provider. ° °

## 2014-08-13 ENCOUNTER — Telehealth (INDEPENDENT_AMBULATORY_CARE_PROVIDER_SITE_OTHER): Payer: Self-pay | Admitting: Internal Medicine

## 2014-08-13 NOTE — Telephone Encounter (Signed)
Will treat with with Harvoni x 12 weeks or Viekira + Ribavirin 1200mg  x 12 weeks.

## 2014-08-13 NOTE — Telephone Encounter (Signed)
This is in result note and will be addressed on 08/15/14.

## 2014-08-21 ENCOUNTER — Telehealth (INDEPENDENT_AMBULATORY_CARE_PROVIDER_SITE_OTHER): Payer: Self-pay | Admitting: *Deleted

## 2014-08-21 NOTE — Telephone Encounter (Signed)
Rec'd a phone call from [facility]. Hector DikeJennifer states that the preferred medication to treat Hep C is with Harvoni. We had [facility]. The approvall dates are 08/20/14-11/12/14. They feel that this is a 12 week treatment. They need a prescription for this medication faxed to them.

## 2014-08-22 ENCOUNTER — Telehealth (INDEPENDENT_AMBULATORY_CARE_PROVIDER_SITE_OTHER): Payer: Self-pay | Admitting: Internal Medicine

## 2014-08-22 NOTE — Telephone Encounter (Signed)
Rx for Harvoni x 12 given to [name].

## 2014-08-22 NOTE — Telephone Encounter (Signed)
Patient will come by office to sign contract

## 2014-08-22 NOTE — Telephone Encounter (Signed)
A prescription for Harvoni - Take 1 by mouth daily with or with out food #28 , 2 refills was called to the [facility]. They will contact the patient and discuss any copays if any and arrange a shipment date to the patient 's home. They are going to also ask that he call our office once he rec's the medication. Patient was also called and made aware of this by me.

## 2014-08-29 ENCOUNTER — Telehealth (INDEPENDENT_AMBULATORY_CARE_PROVIDER_SITE_OTHER): Payer: Self-pay | Admitting: Internal Medicine

## 2014-08-29 ENCOUNTER — Encounter (INDEPENDENT_AMBULATORY_CARE_PROVIDER_SITE_OTHER): Payer: Self-pay | Admitting: *Deleted

## 2014-08-29 ENCOUNTER — Other Ambulatory Visit (INDEPENDENT_AMBULATORY_CARE_PROVIDER_SITE_OTHER): Payer: Self-pay | Admitting: *Deleted

## 2014-08-29 ENCOUNTER — Telehealth (INDEPENDENT_AMBULATORY_CARE_PROVIDER_SITE_OTHER): Payer: Self-pay | Admitting: *Deleted

## 2014-08-29 DIAGNOSIS — B182 Chronic viral hepatitis C: Secondary | ICD-10-CM

## 2014-08-29 NOTE — Telephone Encounter (Signed)
noted 

## 2014-08-29 NOTE — Telephone Encounter (Signed)
Lupita LeashDonna, OV in 4 weeks. I have spoken with patient.   [name]: CBC with diff, cmet, and Hep C quaint in 4 weeks

## 2014-08-29 NOTE — Telephone Encounter (Signed)
.  Per Delrae Renderri Setzer,NP patient has started his Harvoni and he will need to have his labs drawn in 4 weeks. Labs are noted for 09/26/14.

## 2014-08-29 NOTE — Telephone Encounter (Signed)
Patient labs are noted for 09/26/14 Hector [name] has been sent.

## 2014-08-29 NOTE — Telephone Encounter (Signed)
Hector [name], Zahi called and I tried returning his call. He has received his medication. Needs OV in 4 weeks. Ask him when he is going to start the Harvoni.  [name], CBC with diff, and Cmet in 4 weeks. Patient has received his medication

## 2014-08-29 NOTE — Addendum Note (Signed)
Addended by: Shona NeedlesDD, Braden Cimo M on: 08/29/2014 04:26 PM   Modules accepted: Orders

## 2014-08-29 NOTE — Telephone Encounter (Signed)
Started Harvoni 08/29/2014

## 2014-08-31 NOTE — Telephone Encounter (Signed)
LM for patient to return the call.  

## 2014-09-05 NOTE — Telephone Encounter (Signed)
LM for patient to return the call.  

## 2014-09-11 ENCOUNTER — Encounter (INDEPENDENT_AMBULATORY_CARE_PROVIDER_SITE_OTHER): Payer: Self-pay | Admitting: *Deleted

## 2014-09-11 NOTE — Telephone Encounter (Signed)
Left my 3rd message with a girl that answers the phone. Advised her that it was very import that Hector [name] returns the call at [phone].

## 2014-09-11 NOTE — Telephone Encounter (Signed)
Apt scheduled for 09/28/14 at 10:00 am with Dorene Arerri Setzer, NP.

## 2014-09-11 NOTE — Telephone Encounter (Signed)
Apt scheduled for 09/28/14 at 10:00 am with Terri Setzer, NP.  

## 2014-09-11 NOTE — Telephone Encounter (Signed)
Left my 3rd message with a girl that answers the phone. Advised her that it was very import that Hector [name] returns the call at [phone]. 

## 2014-09-27 LAB — CBC WITH DIFFERENTIAL/PLATELET
BASOS PCT: 0 % (ref 0–1)
Basophils Absolute: 0 10*3/uL (ref 0.0–0.1)
EOS ABS: 0.1 10*3/uL (ref 0.0–0.7)
Eosinophils Relative: 1 % (ref 0–5)
HCT: 46.2 % (ref 39.0–52.0)
Hemoglobin: 15.7 g/dL (ref 13.0–17.0)
Lymphocytes Relative: 19 % (ref 12–46)
Lymphs Abs: 1.2 10*3/uL (ref 0.7–4.0)
MCH: 28.1 pg (ref 26.0–34.0)
MCHC: 34 g/dL (ref 30.0–36.0)
MCV: 82.6 fL (ref 78.0–100.0)
MONO ABS: 0.5 10*3/uL (ref 0.1–1.0)
MPV: 11.4 fL (ref 8.6–12.4)
Monocytes Relative: 7 % (ref 3–12)
Neutro Abs: 4.7 10*3/uL (ref 1.7–7.7)
Neutrophils Relative %: 73 % (ref 43–77)
Platelets: 112 10*3/uL — ABNORMAL LOW (ref 150–400)
RBC: 5.59 MIL/uL (ref 4.22–5.81)
RDW: 13.5 % (ref 11.5–15.5)
WBC: 6.5 10*3/uL (ref 4.0–10.5)

## 2014-09-27 LAB — COMPREHENSIVE METABOLIC PANEL
ALK PHOS: 85 U/L (ref 39–117)
ALT: 11 U/L (ref 0–53)
AST: 16 U/L (ref 0–37)
Albumin: 3.9 g/dL (ref 3.5–5.2)
BUN: 6 mg/dL (ref 6–23)
CALCIUM: 9.1 mg/dL (ref 8.4–10.5)
CO2: 24 mEq/L (ref 19–32)
Chloride: 102 mEq/L (ref 96–112)
Creat: 0.78 mg/dL (ref 0.50–1.35)
GLUCOSE: 131 mg/dL — AB (ref 70–99)
Potassium: 3.7 mEq/L (ref 3.5–5.3)
Sodium: 138 mEq/L (ref 135–145)
Total Bilirubin: 0.6 mg/dL (ref 0.2–1.2)
Total Protein: 7 g/dL (ref 6.0–8.3)

## 2014-09-27 LAB — HEPATITIS C RNA QUANTITATIVE: HCV Quantitative: NOT DETECTED IU/mL (ref <15)

## 2014-09-28 ENCOUNTER — Encounter (INDEPENDENT_AMBULATORY_CARE_PROVIDER_SITE_OTHER): Payer: Self-pay | Admitting: *Deleted

## 2014-09-28 ENCOUNTER — Telehealth (INDEPENDENT_AMBULATORY_CARE_PROVIDER_SITE_OTHER): Payer: Self-pay | Admitting: *Deleted

## 2014-09-28 ENCOUNTER — Ambulatory Visit (INDEPENDENT_AMBULATORY_CARE_PROVIDER_SITE_OTHER): Payer: Medicare Other | Admitting: Internal Medicine

## 2014-09-28 ENCOUNTER — Encounter (INDEPENDENT_AMBULATORY_CARE_PROVIDER_SITE_OTHER): Payer: Self-pay | Admitting: Internal Medicine

## 2014-09-28 VITALS — BP 176/100 | HR 76 | Temp 97.7°F | Ht 69.5 in | Wt 191.7 lb

## 2014-09-28 DIAGNOSIS — B1920 Unspecified viral hepatitis C without hepatic coma: Secondary | ICD-10-CM | POA: Insufficient documentation

## 2014-09-28 DIAGNOSIS — M549 Dorsalgia, unspecified: Secondary | ICD-10-CM

## 2014-09-28 DIAGNOSIS — G8929 Other chronic pain: Secondary | ICD-10-CM | POA: Insufficient documentation

## 2014-09-28 DIAGNOSIS — B182 Chronic viral hepatitis C: Secondary | ICD-10-CM

## 2014-09-28 NOTE — Telephone Encounter (Signed)
.  Per Terri Setzer,NP patient to have labs drawn in 4 weeks 

## 2014-09-28 NOTE — Patient Instructions (Signed)
OV in 4 weeks.with a CBC and Hepatic function and Hep C quaint.

## 2014-09-28 NOTE — Progress Notes (Signed)
   Subjective:    Patient ID: Hector [name], male    DOB: 01-30-1966, 49 y.o.   MRN: [id number]  HPI Here today for f/u of his Hepatitis C. Diagnosed in 89 in [place]. Did not receive tx. No hx of IV drug use. Patient has tattoos.  Genotype 1A Started Harvoni 08/29/2014  1 07/13/2014 Liver Biopsy: Grade 3 Fibrosis 3. Will be treated for 12 weeks.   This is week 5 for him. He tells me is doing well. No change in his appetite. No depression. No rash. Appetite has remained good. No abdominal pain.  09/26/2014 Hep C quaint undetectable.   CBC    Component Value Date/Time   WBC 6.5 09/26/2014 1246   RBC 5.59 09/26/2014 1246   HGB 15.7 09/26/2014 1246   HCT 46.2 09/26/2014 1246   PLT 112* 09/26/2014 1246   MCV 82.6 09/26/2014 1246   MCH 28.1 09/26/2014 1246   MCHC 34.0 09/26/2014 1246   RDW 13.5 09/26/2014 1246   LYMPHSABS 1.2 09/26/2014 1246   MONOABS 0.5 09/26/2014 1246   EOSABS 0.1 09/26/2014 1246   BASOSABS 0.0 09/26/2014 1246   Hepatic Function Latest Ref Rng 09/26/2014 07/06/2014  Total Protein 6.0 - 8.3 g/dL 7.0 7.3  Albumin 3.5 - 5.2 g/dL 3.9 4.0  AST 0 - 37 U/L 16 25  ALT 0 - 53 U/L 11 22  Alk Phosphatase 39 - 117 U/L 85 94  Total Bilirubin 0.2 - 1.2 mg/dL 0.6 0.7  Bilirubin, Direct 0.0 - 0.3 mg/dL - 0.2       Review of Systems Past Medical History  Diagnosis Date  . Degenerative disc disease   . Hepatitis C     Past Surgical History  Procedure Laterality Date  . Back surgery    . Spinal fusion      Allergies  Allergen Reactions  . Hydrocodone Itching    Current Outpatient Prescriptions on File Prior to Visit  Medication Sig Dispense Refill  . ibuprofen (ADVIL,MOTRIN) 600 MG tablet Take 1 tablet (600 mg total) by mouth every 6 (six) hours as needed. 30 tablet 0  . OxyCODONE (OXYCONTIN) 10 mg T12A 12 hr tablet Take 10 mg by mouth 4 (four) times daily.    Marland Kitchen oxyCODONE-acetaminophen (PERCOCET/ROXICET) 5-325 MG per tablet Take 1 tablet by mouth every 4  (four) hours as needed. 20 tablet 0   No current facility-administered medications on file prior to visit.        Objective:   Physical Exam Blood pressure 176/100, pulse 76, temperature 97.7 F (36.5 C), height 5' 9.5" (1.765 m), weight 191 lb 11.2 oz (86.955 kg). Alert and oriented. Skin warm and dry. Oral mucosa is moist.   . Sclera anicteric, conjunctivae is pink. Thyroid not enlarged. No cervical lymphadenopathy. Lungs clear. Heart regular rate and rhythm.  Abdomen is soft. Bowel sounds are positive. No hepatomegaly. No abdominal masses felt. No tenderness.  No edema to lower extremities.          Assessment & Plan:  Hepatitis C. Viral load undetectable. Will see back in 4 weeks. No side effects from medicaiton.

## 2014-11-01 LAB — HEPATIC FUNCTION PANEL
ALK PHOS: 89 U/L (ref 39–117)
ALT: 11 U/L (ref 0–53)
AST: 15 U/L (ref 0–37)
Albumin: 3.8 g/dL (ref 3.5–5.2)
BILIRUBIN DIRECT: 0.1 mg/dL (ref 0.0–0.3)
BILIRUBIN TOTAL: 0.7 mg/dL (ref 0.2–1.2)
Indirect Bilirubin: 0.6 mg/dL (ref 0.2–1.2)
Total Protein: 6.9 g/dL (ref 6.0–8.3)

## 2014-11-01 LAB — CBC WITH DIFFERENTIAL/PLATELET
Basophils Absolute: 0 10*3/uL (ref 0.0–0.1)
Basophils Relative: 0 % (ref 0–1)
EOS PCT: 1 % (ref 0–5)
Eosinophils Absolute: 0.1 10*3/uL (ref 0.0–0.7)
HEMATOCRIT: 46.6 % (ref 39.0–52.0)
HEMOGLOBIN: 16.1 g/dL (ref 13.0–17.0)
Lymphocytes Relative: 23 % (ref 12–46)
Lymphs Abs: 1.9 10*3/uL (ref 0.7–4.0)
MCH: 28.3 pg (ref 26.0–34.0)
MCHC: 34.5 g/dL (ref 30.0–36.0)
MCV: 82 fL (ref 78.0–100.0)
MPV: 11.6 fL (ref 8.6–12.4)
Monocytes Absolute: 0.8 10*3/uL (ref 0.1–1.0)
Monocytes Relative: 9 % (ref 3–12)
NEUTROS PCT: 67 % (ref 43–77)
Neutro Abs: 5.6 10*3/uL (ref 1.7–7.7)
Platelets: 151 10*3/uL (ref 150–400)
RBC: 5.68 MIL/uL (ref 4.22–5.81)
RDW: 13.5 % (ref 11.5–15.5)
WBC: 8.4 10*3/uL (ref 4.0–10.5)

## 2014-11-01 LAB — HEPATITIS C RNA QUANTITATIVE: HCV QUANT: NOT DETECTED [IU]/mL (ref <15)

## 2014-11-02 ENCOUNTER — Ambulatory Visit (INDEPENDENT_AMBULATORY_CARE_PROVIDER_SITE_OTHER): Payer: Medicare Other | Admitting: Internal Medicine

## 2014-11-13 ENCOUNTER — Telehealth (INDEPENDENT_AMBULATORY_CARE_PROVIDER_SITE_OTHER): Payer: Self-pay | Admitting: Internal Medicine

## 2014-11-13 NOTE — Telephone Encounter (Signed)
Lupita LeashDonna, Needs OV. Missed last one

## 2014-11-16 NOTE — Telephone Encounter (Signed)
LM for patient to return the call.  

## 2014-11-19 NOTE — Telephone Encounter (Signed)
Apt has been scheduled for 11/30/14 with Dorene Arerri Setzer, NP. He will have finished his medication on 11/22/14. Would like to know if he should have any lab work done before the apt. Please call [phone] and leave a message with Page.

## 2014-11-19 NOTE — Telephone Encounter (Signed)
I left a message stating I would order lab after OV

## 2014-11-28 ENCOUNTER — Telehealth (INDEPENDENT_AMBULATORY_CARE_PROVIDER_SITE_OTHER): Payer: Self-pay | Admitting: Internal Medicine

## 2014-11-28 DIAGNOSIS — B1920 Unspecified viral hepatitis C without hepatic coma: Secondary | ICD-10-CM

## 2014-11-28 NOTE — Telephone Encounter (Signed)
OV Friday. Labs today

## 2014-11-29 LAB — CBC WITH DIFFERENTIAL/PLATELET
Basophils Absolute: 0 10*3/uL (ref 0.0–0.1)
Basophils Relative: 0 % (ref 0–1)
EOS ABS: 0.1 10*3/uL (ref 0.0–0.7)
Eosinophils Relative: 1 % (ref 0–5)
HEMATOCRIT: 44.6 % (ref 39.0–52.0)
HEMOGLOBIN: 15.2 g/dL (ref 13.0–17.0)
LYMPHS ABS: 2.2 10*3/uL (ref 0.7–4.0)
Lymphocytes Relative: 29 % (ref 12–46)
MCH: 28.1 pg (ref 26.0–34.0)
MCHC: 34.1 g/dL (ref 30.0–36.0)
MCV: 82.6 fL (ref 78.0–100.0)
MPV: 11.9 fL (ref 8.6–12.4)
Monocytes Absolute: 0.6 10*3/uL (ref 0.1–1.0)
Monocytes Relative: 8 % (ref 3–12)
NEUTROS PCT: 62 % (ref 43–77)
Neutro Abs: 4.7 10*3/uL (ref 1.7–7.7)
Platelets: 148 10*3/uL — ABNORMAL LOW (ref 150–400)
RBC: 5.4 MIL/uL (ref 4.22–5.81)
RDW: 13.6 % (ref 11.5–15.5)
WBC: 7.5 10*3/uL (ref 4.0–10.5)

## 2014-11-29 LAB — COMPREHENSIVE METABOLIC PANEL
ALK PHOS: 98 U/L (ref 39–117)
ALT: 9 U/L (ref 0–53)
AST: 14 U/L (ref 0–37)
Albumin: 3.8 g/dL (ref 3.5–5.2)
BILIRUBIN TOTAL: 0.8 mg/dL (ref 0.2–1.2)
BUN: 11 mg/dL (ref 6–23)
CO2: 25 mEq/L (ref 19–32)
CREATININE: 0.78 mg/dL (ref 0.50–1.35)
Calcium: 9.2 mg/dL (ref 8.4–10.5)
Chloride: 102 mEq/L (ref 96–112)
GLUCOSE: 124 mg/dL — AB (ref 70–99)
Potassium: 3.9 mEq/L (ref 3.5–5.3)
Sodium: 138 mEq/L (ref 135–145)
TOTAL PROTEIN: 7.4 g/dL (ref 6.0–8.3)

## 2014-11-29 LAB — HEPATITIS C RNA QUANTITATIVE: HCV QUANT: NOT DETECTED [IU]/mL (ref <15)

## 2014-11-30 ENCOUNTER — Ambulatory Visit (INDEPENDENT_AMBULATORY_CARE_PROVIDER_SITE_OTHER): Payer: Medicare Other | Admitting: Internal Medicine

## 2014-11-30 ENCOUNTER — Encounter (INDEPENDENT_AMBULATORY_CARE_PROVIDER_SITE_OTHER): Payer: Self-pay | Admitting: Internal Medicine

## 2014-11-30 ENCOUNTER — Telehealth (INDEPENDENT_AMBULATORY_CARE_PROVIDER_SITE_OTHER): Payer: Self-pay | Admitting: *Deleted

## 2014-11-30 VITALS — BP 108/60 | HR 72 | Temp 98.0°F | Ht 69.0 in | Wt 198.1 lb

## 2014-11-30 DIAGNOSIS — B182 Chronic viral hepatitis C: Secondary | ICD-10-CM

## 2014-11-30 DIAGNOSIS — B1920 Unspecified viral hepatitis C without hepatic coma: Secondary | ICD-10-CM

## 2014-11-30 NOTE — Progress Notes (Signed)
   Subjective:    Patient ID: Hector [name], male    DOB: 02/12/1966, 49 y.o.   MRN: [id number]  HPI Here today for f/u of his Hepatitis C. Genotype 1a. He has finished treated. Treated with Harvoni x 12 weeks. Hep C quaint 11/28/2014 undetected. He is doing well. His appetite has remained good. No weight loss. Usually has a BM daily. Patient is disabled: multiple [name] surgeries. He does have multiple tattoos. CBC    Component Value Date/Time   WBC 7.5 11/28/2014 1357   RBC 5.40 11/28/2014 1357   HGB 15.2 11/28/2014 1357   HCT 44.6 11/28/2014 1357   PLT 148* 11/28/2014 1357   MCV 82.6 11/28/2014 1357   MCH 28.1 11/28/2014 1357   MCHC 34.1 11/28/2014 1357   RDW 13.6 11/28/2014 1357   LYMPHSABS 2.2 11/28/2014 1357   MONOABS 0.6 11/28/2014 1357   EOSABS 0.1 11/28/2014 1357   BASOSABS 0.0 11/28/2014 1357    Hepatic Function Panel     Component Value Date/Time   PROT 7.4 11/28/2014 1357   ALBUMIN 3.8 11/28/2014 1357   AST 14 11/28/2014 1357   ALT 9 11/28/2014 1357   ALKPHOS 98 11/28/2014 1357   BILITOT 0.8 11/28/2014 1357   BILIDIR 0.1 10/31/2014 0931   IBILI 0.6 10/31/2014 0931        Review of Systems Past Medical History  Diagnosis Date  . Degenerative disc disease   . Hepatitis C     Past Surgical History  Procedure Laterality Date  . [name] surgery    . Spinal fusion      Allergies  Allergen Reactions  . Hydrocodone Itching    Current Outpatient Prescriptions on File Prior to Visit  Medication Sig Dispense Refill  . ibuprofen (ADVIL,MOTRIN) 600 MG tablet Take 1 tablet (600 mg total) by mouth every 6 (six) hours as needed. 30 tablet 0  . Ledipasvir-Sofosbuvir 90-400 MG TABS Take by mouth.    . OxyCODONE (OXYCONTIN) 10 mg T12A 12 hr tablet Take 10 mg by mouth 4 (four) times daily.    Marland Kitchen. oxyCODONE-acetaminophen (PERCOCET/ROXICET) 5-325 MG per tablet Take 1 tablet by mouth every 4 (four) hours as needed. 20 tablet 0   No current facility-administered  medications on file prior to visit.        Objective:   Physical Exam There were no vitals taken for this visit.        Assessment & Plan:  Hepatitis C. He has cleared the virus.

## 2014-11-30 NOTE — Telephone Encounter (Signed)
.  Per Delrae Renderri Setzer,NP the patient is to have labs in 6 months.

## 2014-11-30 NOTE — Patient Instructions (Signed)
Ov in 6 months with an Hepatic profile, and Hep C quaint.

## 2015-04-23 ENCOUNTER — Encounter (INDEPENDENT_AMBULATORY_CARE_PROVIDER_SITE_OTHER): Payer: Self-pay | Admitting: *Deleted

## 2015-04-23 ENCOUNTER — Other Ambulatory Visit (INDEPENDENT_AMBULATORY_CARE_PROVIDER_SITE_OTHER): Payer: Self-pay | Admitting: *Deleted

## 2015-04-23 DIAGNOSIS — B182 Chronic viral hepatitis C: Secondary | ICD-10-CM

## 2015-06-03 ENCOUNTER — Ambulatory Visit (INDEPENDENT_AMBULATORY_CARE_PROVIDER_SITE_OTHER): Payer: Medicare Other | Admitting: Internal Medicine

## 2015-06-04 ENCOUNTER — Encounter (INDEPENDENT_AMBULATORY_CARE_PROVIDER_SITE_OTHER): Payer: Self-pay | Admitting: *Deleted

## 2015-07-01 ENCOUNTER — Other Ambulatory Visit (HOSPITAL_COMMUNITY): Payer: Self-pay | Admitting: Family Medicine

## 2015-07-01 ENCOUNTER — Ambulatory Visit (HOSPITAL_COMMUNITY)
Admission: RE | Admit: 2015-07-01 | Discharge: 2015-07-01 | Disposition: A | Discharge status: Home / Self Care | Payer: Medicare Other | Source: Ambulatory Visit | Attending: Family Medicine | Admitting: Family Medicine

## 2015-07-01 DIAGNOSIS — M19011 Primary osteoarthritis, right shoulder: Secondary | ICD-10-CM

## 2015-07-01 DIAGNOSIS — M4317 Spondylolisthesis, lumbosacral region: Secondary | ICD-10-CM | POA: Insufficient documentation

## 2015-07-01 DIAGNOSIS — M25511 Pain in right shoulder: Secondary | ICD-10-CM | POA: Diagnosis not present

## 2015-07-01 DIAGNOSIS — M47816 Spondylosis without myelopathy or radiculopathy, lumbar region: Secondary | ICD-10-CM | POA: Diagnosis not present

## 2015-07-01 DIAGNOSIS — M546 Pain in thoracic spine: Secondary | ICD-10-CM | POA: Diagnosis present

## 2015-07-01 DIAGNOSIS — G8929 Other chronic pain: Secondary | ICD-10-CM | POA: Insufficient documentation

## 2015-07-01 DIAGNOSIS — M545 Low back pain: Secondary | ICD-10-CM

## 2015-10-24 IMAGING — CR DG LUMBAR SPINE COMPLETE 4+V
5 series · 5 of 5 positions shown · non-contrast
Comparison: None.

CLINICAL DATA: Low back pain, fall

EXAM:
LUMBAR SPINE - COMPLETE 4+ VIEW

[view not recorded (1 of 5)]
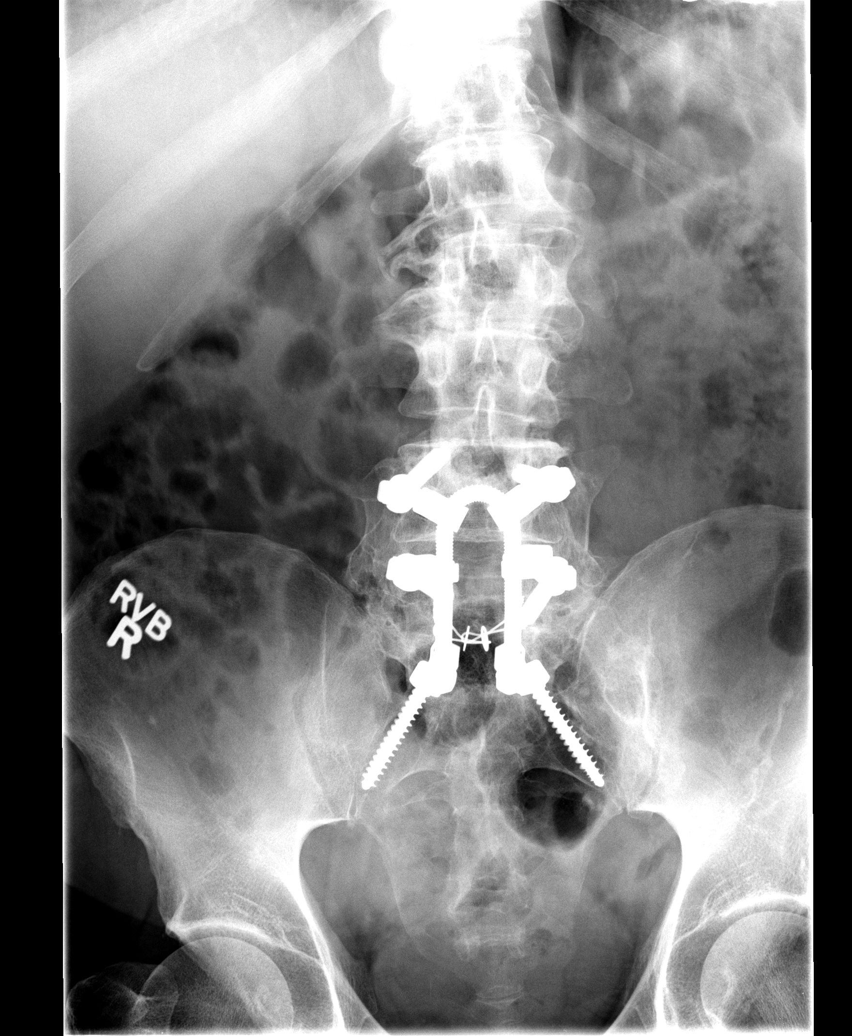

[view not recorded (2 of 5)]
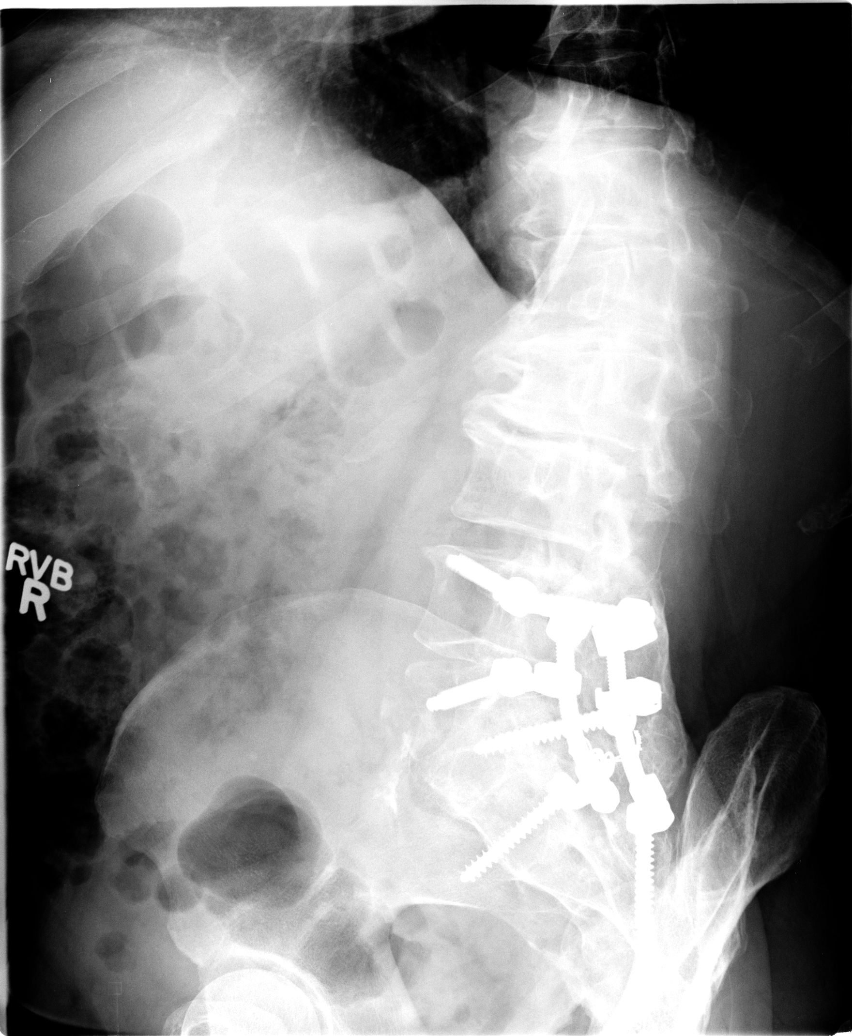

[view not recorded (3 of 5)]
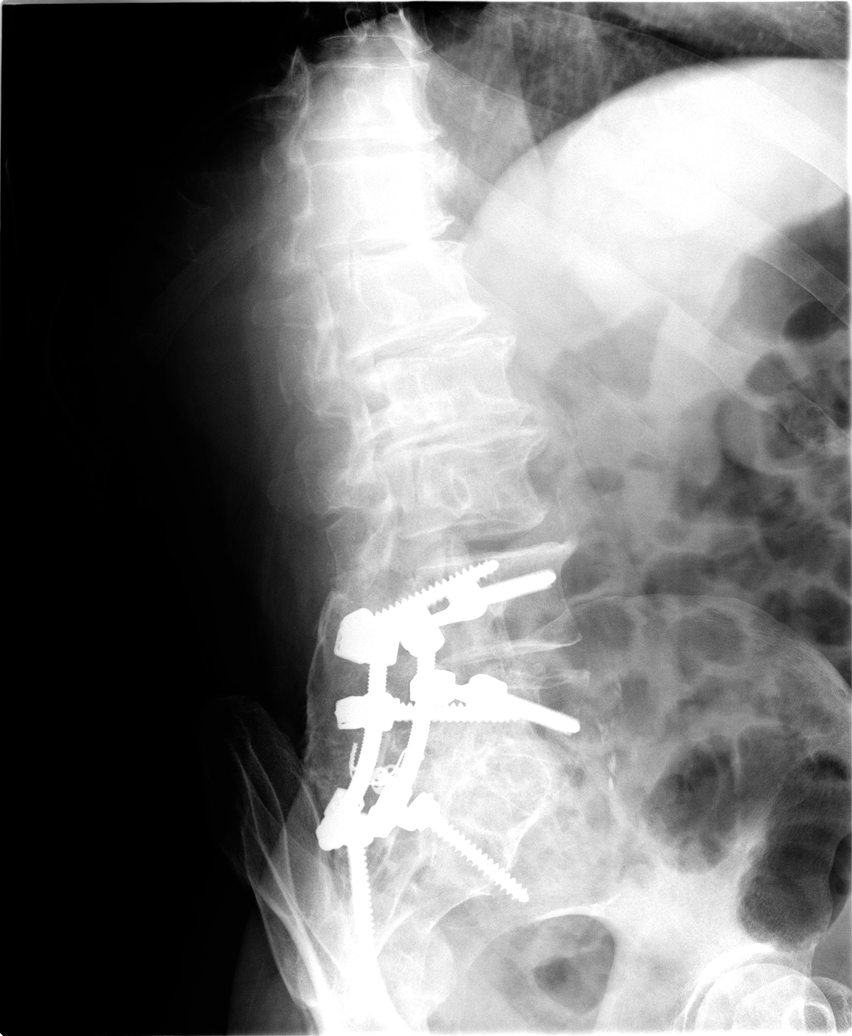

[view not recorded (4 of 5)]
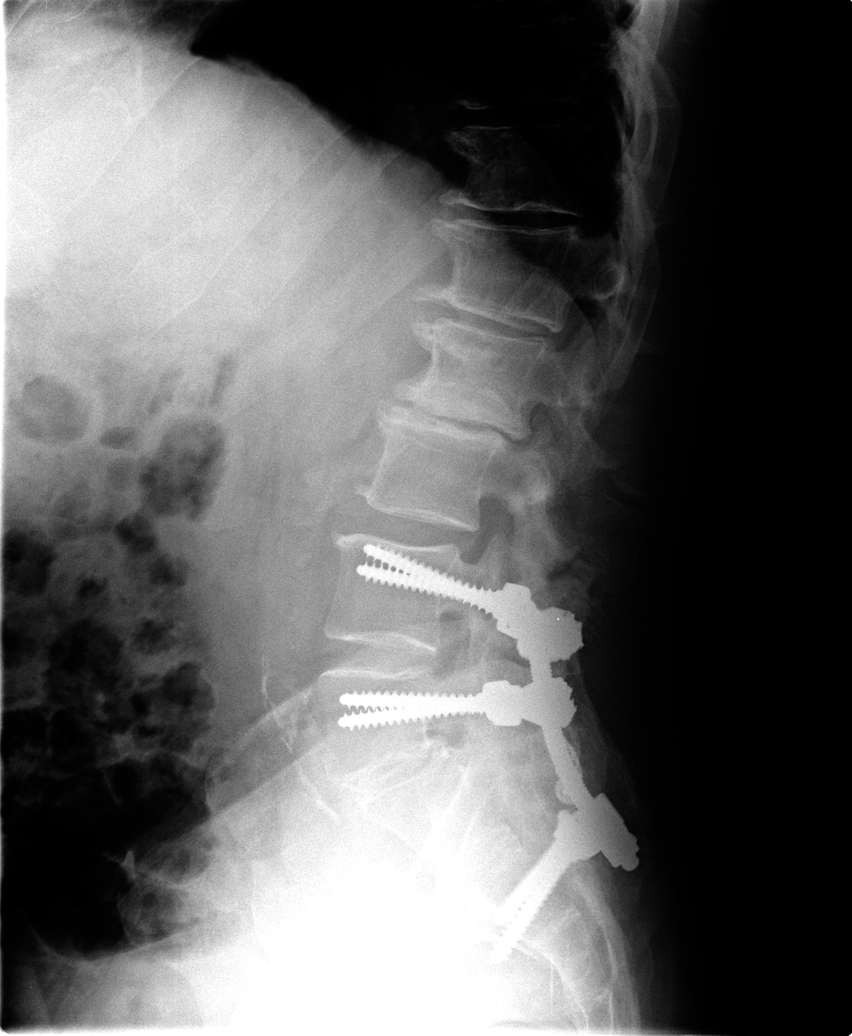

[view not recorded (5 of 5)]
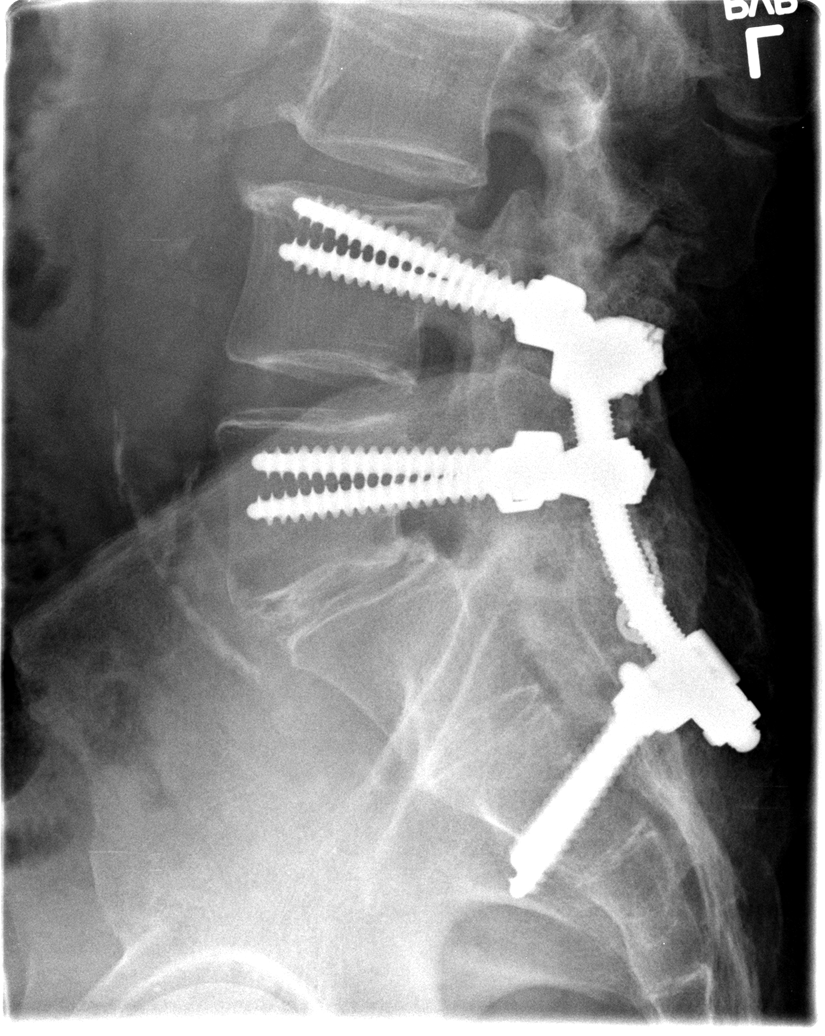

[5 of 5 positions shown; findings below may reference images not displayed]

FINDINGS: Five lumbar type vertebral bodies.

Normal lumbar lordosis.

No evidence of fracture dislocation.  Body heights are maintained.

Moderate multilevel degenerative changes.

Prior PLIF with pedicle screws at L4, L5, and S2.

Visualized bony pelvis appears intact.
IMPRESSION: No fracture or dislocation is seen.

Moderate multilevel degenerative changes.

Postsurgical changes, as above.

## 2017-10-06 ENCOUNTER — Other Ambulatory Visit (HOSPITAL_COMMUNITY): Payer: Self-pay | Admitting: Family Medicine

## 2017-10-06 ENCOUNTER — Ambulatory Visit (HOSPITAL_COMMUNITY)
Admission: RE | Admit: 2017-10-06 | Discharge: 2017-10-06 | Disposition: A | Discharge status: Home / Self Care | Payer: Medicare HMO | Source: Ambulatory Visit | Attending: Family Medicine | Admitting: Family Medicine

## 2017-10-06 DIAGNOSIS — M1990 Unspecified osteoarthritis, unspecified site: Secondary | ICD-10-CM | POA: Insufficient documentation

## 2017-10-06 DIAGNOSIS — M50322 Other cervical disc degeneration at C5-C6 level: Secondary | ICD-10-CM | POA: Insufficient documentation

## 2017-10-12 ENCOUNTER — Other Ambulatory Visit (HOSPITAL_COMMUNITY): Payer: Self-pay | Admitting: Family Medicine

## 2017-10-12 DIAGNOSIS — M542 Cervicalgia: Secondary | ICD-10-CM

## 2017-10-20 ENCOUNTER — Ambulatory Visit (HOSPITAL_COMMUNITY)
Admission: RE | Admit: 2017-10-20 | Discharge: 2017-10-20 | Disposition: A | Discharge status: Home / Self Care | Payer: Medicare HMO | Source: Ambulatory Visit | Attending: Family Medicine | Admitting: Family Medicine

## 2017-10-20 DIAGNOSIS — M4802 Spinal stenosis, cervical region: Secondary | ICD-10-CM | POA: Insufficient documentation

## 2017-10-20 DIAGNOSIS — M47812 Spondylosis without myelopathy or radiculopathy, cervical region: Secondary | ICD-10-CM | POA: Insufficient documentation

## 2017-10-20 DIAGNOSIS — M5030 Other cervical disc degeneration, unspecified cervical region: Secondary | ICD-10-CM | POA: Diagnosis not present

## 2017-10-20 DIAGNOSIS — M542 Cervicalgia: Secondary | ICD-10-CM | POA: Insufficient documentation

## 2017-12-05 ENCOUNTER — Other Ambulatory Visit: Payer: Self-pay

## 2017-12-05 ENCOUNTER — Emergency Department (HOSPITAL_COMMUNITY)
Admission: EM | Admit: 2017-12-05 | Discharge: 2017-12-05 | Disposition: A | Discharge status: Home / Self Care | Payer: Medicare HMO | Attending: Emergency Medicine | Admitting: Emergency Medicine

## 2017-12-05 ENCOUNTER — Encounter (HOSPITAL_COMMUNITY): Payer: Self-pay | Admitting: Emergency Medicine

## 2017-12-05 ENCOUNTER — Emergency Department (HOSPITAL_COMMUNITY): Payer: Medicare HMO

## 2017-12-05 DIAGNOSIS — Y939 Activity, unspecified: Secondary | ICD-10-CM | POA: Diagnosis not present

## 2017-12-05 DIAGNOSIS — W540XXA Bitten by dog, initial encounter: Secondary | ICD-10-CM | POA: Diagnosis not present

## 2017-12-05 DIAGNOSIS — Y929 Unspecified place or not applicable: Secondary | ICD-10-CM | POA: Insufficient documentation

## 2017-12-05 DIAGNOSIS — Z79899 Other long term (current) drug therapy: Secondary | ICD-10-CM | POA: Insufficient documentation

## 2017-12-05 DIAGNOSIS — F17210 Nicotine dependence, cigarettes, uncomplicated: Secondary | ICD-10-CM | POA: Diagnosis not present

## 2017-12-05 DIAGNOSIS — L03114 Cellulitis of left upper limb: Secondary | ICD-10-CM

## 2017-12-05 DIAGNOSIS — Y999 Unspecified external cause status: Secondary | ICD-10-CM | POA: Diagnosis not present

## 2017-12-05 DIAGNOSIS — M79642 Pain in left hand: Secondary | ICD-10-CM | POA: Diagnosis present

## 2017-12-05 MED ORDER — AMOXICILLIN-POT CLAVULANATE 875-125 MG PO TABS
1.0000 | ORAL_TABLET | Freq: Once | ORAL | Status: AC
  Administered 2017-12-05: 1 via ORAL
  Filled 2017-12-05: qty 1

## 2017-12-05 MED ORDER — SODIUM CHLORIDE 0.9 % IV SOLN
1.0000 g | Freq: Once | INTRAVENOUS | Status: AC
  Administered 2017-12-05: 1 g via INTRAVENOUS
  Filled 2017-12-05: qty 10

## 2017-12-05 MED ORDER — AMOXICILLIN-POT CLAVULANATE 875-125 MG PO TABS: 1.0000 | ORAL_TABLET | Freq: Two times a day (BID) | ORAL | 0 refills | Status: AC

## 2017-12-05 NOTE — ED Triage Notes (Signed)
Patient c/o dog bite to left hand in which he got bitten on Friday by his dog. Per patient playing with dog with plastic bottle and accidental moved hand in path of dog biting bottle. Patient states hand swollen and red, warm to touch. Patient reports some drainage from site of dog bite. Bandaid in place. Denies any fevers. Per patient dog's vaccination's up-to-date. Patient unsure of his last tetanus vaccination.

## 2017-12-05 NOTE — ED Provider Notes (Signed)
[facility] EMERGENCY DEPARTMENT Provider Note   CSN: [id number] Arrival date & time: 12/05/17  1230     History   Chief Complaint Chief Complaint  Patient presents with  . Animal Bite    HPI Hector [name] is a 52 y.o. male with history of hepatitis C, degenerative disc disease who presents with left hand pain and wound after being bit by his dog 2 days ago.  He reports he washed the wound out very well at home, however over the past 2 days he has had increasing pain and swelling to the dorsal aspect of his left hand.  He has had some bloody drainage from the wound.  He has not been taking anything at home for symptoms.  He has no numbness or tingling.  He has some pain in the area when he moves his hand, however has full range of motion of his hand.  His tetanus is up-to-date.  His dog's vaccinations are up-to-date as well.  HPI  Past Medical History:  Diagnosis Date  . Degenerative disc disease   . Hepatitis C     Patient Active Problem List   Diagnosis Date Noted  . Hepatitis C 09/28/2014  . Chronic back pain 09/28/2014    Past Surgical History:  Procedure Laterality Date  . BACK SURGERY    . SPINAL FUSION          Home Medications    Prior to Admission medications   Medication Sig Start Date End Date Taking? Authorizing Provider  amoxicillin-clavulanate (AUGMENTIN) 875-125 MG tablet Take 1 tablet by mouth every 12 (twelve) hours. 12/05/17   Parthiv Mucci, Waylan Boga, PA-C  ibuprofen (ADVIL,MOTRIN) 600 MG tablet Take 1 tablet (600 mg total) by mouth every 6 (six) hours as needed. 03/11/14   Burgess Amor, PA-C  Ledipasvir-Sofosbuvir 90-400 MG TABS Take by mouth.    [provider]  OxyCODONE (OXYCONTIN) 10 mg T12A 12 hr tablet Take 10 mg by mouth 4 (four) times daily.    [provider]  oxyCODONE-acetaminophen (PERCOCET/ROXICET) 5-325 MG per tablet Take 1 tablet by mouth every 4 (four) hours as needed. 03/11/14   Burgess Amor, PA-C    Family History Family  History  Problem Relation Age of Onset  . Diabetes Mother   . Diabetes Father     Social History Social History   Tobacco Use  . Smoking status: Current Every Day Smoker    Packs/day: 0.50    Types: Cigarettes  . Smokeless tobacco: Never Used  . Tobacco comment: 1/2-3/4 pack a day x 30 yrs  Substance Use Topics  . Alcohol use: No    Alcohol/week: 0.0 oz  . Drug use: No     Allergies   Hydrocodone   Review of Systems Review of Systems  Constitutional: Negative for chills and fever.  HENT: Negative for facial swelling and sore throat.   Respiratory: Negative for shortness of breath.   Cardiovascular: Negative for chest pain.  Gastrointestinal: Negative for abdominal pain, nausea and vomiting.  Genitourinary: Negative for dysuria.  Musculoskeletal: Positive for arthralgias (L hand). Negative for back pain.  Skin: Positive for color change and wound. Negative for rash.  Neurological: Negative for headaches.  Psychiatric/Behavioral: The patient is not nervous/anxious.      Physical Exam Updated Vital Signs BP (!) 151/89 (BP Location: Right Arm)   Pulse 79   Temp 98.2 F (36.8 C) (Oral)   Resp 16   Ht  (1.753 m)   Wt 91.2  kg (201 lb)   SpO2 96%   BMI 29.68 kg/m   Physical Exam  Constitutional: He appears well-developed and well-nourished. No distress.  HENT:  Head: Normocephalic and atraumatic.  Mouth/Throat: Oropharynx is clear and moist. No oropharyngeal exudate.  Eyes: Pupils are equal, round, and reactive to light. Conjunctivae are normal. Right eye exhibits no discharge. Left eye exhibits no discharge. No scleral icterus.  Neck: Normal range of motion. Neck supple. No thyromegaly present.  Cardiovascular: Normal rate, regular rhythm, normal heart sounds and intact distal pulses. Exam reveals no gallop and no friction rub.  No murmur heard. Pulmonary/Chest: Effort normal and breath sounds normal. No stridor. No respiratory distress. He has no wheezes.  He has no rales.  Abdominal: Soft. Bowel sounds are normal. He exhibits no distension. There is no tenderness. There is no rebound and no guarding.  Musculoskeletal: He exhibits no edema.  Left hand: Less than 1 cm diameter wound with some bloody and serous drainage on the dorsal aspect between the first and second metacarpal, warmth, erythema, and tenderness to the majority of the dorsal aspect of the hand, no tenderness or erythema to any of the digits or wrist, full range of motion with flexion, extension, abduction, abduction of all fingers, radial pulses intact, sensation intact, cap refill less than 2 seconds  Lymphadenopathy:    He has no cervical adenopathy.  Neurological: He is alert. Coordination normal.  Skin: Skin is warm and dry. No rash noted. He is not diaphoretic. No pallor.  Psychiatric: He has a normal mood and affect.  Nursing note and vitals reviewed.      ED Treatments / Results  Labs (all labs ordered are listed, but only abnormal results are displayed) Labs Reviewed - No data to display  EKG None  Radiology Dg Hand Complete Left  Result Date: 12/05/2017 CLINICAL DATA:  LEFT hand pain after dog bite 2 days ago EXAM: LEFT HAND - COMPLETE 3+ VIEW COMPARISON:  None. FINDINGS: No evidence of fracture of the carpal or metacarpal bones. Radiocarpal joint is intact. Phalanges are normal. Soft tissue swelling over the dorsum of the hand no foreign body. IMPRESSION: No fracture or radiodense foreign body. Soft tissue swelling over the dorsum of the hand. Electronically Signed   By: Genevive Bi M.D.   On: 12/05/2017 14:49    Procedures Procedures (including critical care time)  Medications Ordered in ED Medications  amoxicillin-clavulanate (AUGMENTIN) 875-125 MG per tablet 1 tablet (1 tablet Oral Given 12/05/17 1420)  cefTRIAXone (ROCEPHIN) 1 g in sodium chloride 0.9 % 100 mL IVPB (0 g Intravenous Stopped 12/05/17 1554)     Initial Impression / Assessment and Plan /  ED Course  I have reviewed the triage vital signs and the nursing notes.  Pertinent labs & imaging results that were available during my care of the patient were reviewed by me and considered in my medical decision making (see chart for details).     Patient with dog bite to the left hand with cellulitis.  X-ray is negative for fracture or foreign body.  Patient has range of motion of his digits.  No signs of tenosynovitis.  No abscess noted on exam.  Patient given Augmentin and a single dose of IV Rocephin in the ED today.  Will discharge home with Augmentin with very strict return precautions.  Patient is advised that if symptoms are worsening at all or his erythema is spreading outside the demarcated line, he is to return to the emergency department  immediately.  Otherwise, follow-up in 2 days with PCP for wound check.  Patient understands and agrees with plan.  Patient vitals stable throughout ED course and discharged in satisfactory condition.  Patient also evaluated by Dr. Adriana Simas who guided the patient's management and agrees with plan.  Final Clinical Impressions(s) / ED Diagnoses   Final diagnoses:  Dog bite, initial encounter  Cellulitis of hand, left    ED Discharge Orders        Ordered    amoxicillin-clavulanate (AUGMENTIN) 875-125 MG tablet  Every 12 hours     12/05/17 [address] PA-C 12/05/17 1613    Donnetta Hutching, MD 12/06/17 [phone]

## 2017-12-05 NOTE — ED Triage Notes (Signed)
Per records patient had Tetanus vaccination here in ER on 07/02/2013.

## 2017-12-05 NOTE — Discharge Instructions (Addendum)
Medications: Augmentin  Treatment: Take Augmentin twice daily until completed.  Keep your arm elevated if at all possible.  You can take Tylenol and ibuprofen as prescribed over-the-counter, as needed for your pain.  Follow-up: If your hand is looking any worse or moving past the demarcated area tomorrow or the next day or if you develop any new or worsening symptoms, please return to the emergency department for reevaluation.  Please follow-up with your doctor in 2 to 3 days for recheck.

## 2018-01-12 ENCOUNTER — Other Ambulatory Visit: Payer: Self-pay

## 2018-01-12 ENCOUNTER — Emergency Department (HOSPITAL_COMMUNITY)
Admission: EM | Admit: 2018-01-12 | Discharge: 2018-01-13 | Disposition: A | Discharge status: Home / Self Care | Payer: Medicare HMO | Attending: Emergency Medicine | Admitting: Emergency Medicine

## 2018-01-12 ENCOUNTER — Encounter (HOSPITAL_COMMUNITY): Payer: Self-pay

## 2018-01-12 DIAGNOSIS — F17210 Nicotine dependence, cigarettes, uncomplicated: Secondary | ICD-10-CM | POA: Insufficient documentation

## 2018-01-12 DIAGNOSIS — Z79899 Other long term (current) drug therapy: Secondary | ICD-10-CM | POA: Insufficient documentation

## 2018-01-12 DIAGNOSIS — M62830 Muscle spasm of back: Secondary | ICD-10-CM

## 2018-01-12 DIAGNOSIS — M5441 Lumbago with sciatica, right side: Secondary | ICD-10-CM | POA: Diagnosis not present

## 2018-01-12 DIAGNOSIS — M545 Low back pain: Secondary | ICD-10-CM | POA: Diagnosis present

## 2018-01-12 MED ORDER — DIAZEPAM 5 MG PO TABS
10.0000 mg | ORAL_TABLET | Freq: Once | ORAL | Status: AC
  Administered 2018-01-12: 10 mg via ORAL
  Filled 2018-01-12: qty 2

## 2018-01-12 MED ORDER — DEXAMETHASONE SODIUM PHOSPHATE 10 MG/ML IJ SOLN
10.0000 mg | Freq: Once | INTRAMUSCULAR | Status: AC
  Administered 2018-01-12: 10 mg via INTRAMUSCULAR
  Filled 2018-01-12: qty 1

## 2018-01-12 MED ORDER — KETOROLAC TROMETHAMINE 30 MG/ML IJ SOLN
30.0000 mg | Freq: Once | INTRAMUSCULAR | Status: AC
  Administered 2018-01-12: 30 mg via INTRAMUSCULAR
  Filled 2018-01-12: qty 1

## 2018-01-12 NOTE — ED Provider Notes (Signed)
[facility] EMERGENCY DEPARTMENT Provider Note   CSN: [id number] Arrival date & time: 01/12/18  2248  Time seen 23:30 PM    History   Chief Complaint Chief Complaint  Patient presents with  . Back Pain    HPI Hector [name] is a 52 y.o. male.  HPI patient states he had a spinal fusion done in 1991 and has had chronic low back pain since.  He states his pain is always in the right lower back and he has had radiation of pain into his right lateral leg.  He states June 8 his pain got worse.  He denies any change in activity or injury that day or several days before.  He states the pain is on his right side that he describes as stabbing pain and he now has a burning sensation on the anterior portion of his thigh instead of the lateral aspect of his thigh.  He states the burning sensation started just a couple hours ago.  He denies any urinary or fecal incontinence.  He is not followed by any back specialist and states he never had spinal injections done before.  PCP Oval Linsey, MD   Past Medical History:  Diagnosis Date  . Degenerative disc disease   . Hepatitis C     Patient Active Problem List   Diagnosis Date Noted  . Hepatitis C 09/28/2014  . Chronic back pain 09/28/2014    Past Surgical History:  Procedure Laterality Date  . BACK SURGERY    . SPINAL FUSION          Home Medications    Prior to Admission medications   Medication Sig Start Date End Date Taking? Authorizing Provider  acetaminophen (TYLENOL) 500 MG tablet  11/23/17  Yes [provider]  gabapentin (NEURONTIN) 300 MG capsule  01/06/18  Yes [provider]  ibuprofen (ADVIL,MOTRIN) 600 MG tablet Take 1 tablet (600 mg total) by mouth every 6 (six) hours as needed. 03/11/14  Yes Idol, Raynelle Fanning, PA-C  naproxen (NAPROSYN) 500 MG tablet  01/11/18  Yes [provider]  OxyCODONE (OXYCONTIN) 10 mg T12A 12 hr tablet Take 10 mg by mouth 4 (four) times daily.   Yes [provider]  traZODone (DESYREL) 50 MG tablet  01/06/18  Yes [provider]  VENTOLIN HFA 108 (90 Base) MCG/ACT inhaler  12/25/17  Yes [provider]  vitamin B-12 (CYANOCOBALAMIN) 100 MCG tablet  11/23/17  Yes [provider]  Vitamins A & D 5000-400 units CAPS  11/23/17  Yes [provider]  amoxicillin-clavulanate (AUGMENTIN) 875-125 MG tablet Take 1 tablet by mouth every 12 (twelve) hours. 12/05/17   Law, Waylan Boga, PA-C  cyclobenzaprine (FLEXERIL) 10 MG tablet Take 1 tablet (10 mg total) by mouth 3 (three) times daily as needed for muscle spasms. 01/13/18   Devoria Albe, MD  Ledipasvir-Sofosbuvir 90-400 MG TABS Take by mouth.    [provider]  Oxycodone HCl 10 MG TABS  01/06/18   [provider]  oxyCODONE-acetaminophen (PERCOCET/ROXICET) 5-325 MG per tablet Take 1 tablet by mouth every 4 (four) hours as needed. 03/11/14   Burgess Amor, PA-C  predniSONE (DELTASONE) 20 MG tablet Take 3 po QD x 3d , then 2 po QD x 3d then 1 po QD x 3d 01/13/18   Devoria Albe, MD    Family History Family History  Problem Relation Age of Onset  . Diabetes Mother   . Diabetes Father     Social History  Social History   Tobacco Use  . Smoking status: Current Every Day Smoker    Packs/day: 0.50    Types: Cigarettes  . Smokeless tobacco: Never Used  . Tobacco comment: 1/2-3/4 pack a day x 30 yrs  Substance Use Topics  . Alcohol use: No    Alcohol/week: 0.0 oz  . Drug use: No  on disability for chronic back pain Smokes 1 ppd   Allergies   Hydrocodone   Review of Systems Review of Systems  All other systems reviewed and are negative.    Physical Exam Updated Vital Signs BP (!) 158/92 (BP Location: Right Arm)   Pulse 70   Temp 98.2 F (36.8 C) (Oral)   Resp 18   Ht 5\' 9"  (1.753 m)   Wt 93 kg (205 lb)   SpO2 96%   BMI 30.27 kg/m   Vital signs normal except for hypertension   Physical Exam  Constitutional: He is oriented to person, place, and  time. He appears well-developed and well-nourished.  Painful when he changes positions  HENT:  Head: Normocephalic and atraumatic.  Right Ear: External ear normal.  Left Ear: External ear normal.  Nose: Nose normal.  Eyes: Conjunctivae and EOM are normal.  Neck: Normal range of motion.  Cardiovascular: Normal rate.  Pulmonary/Chest: Effort normal. No respiratory distress.  Musculoskeletal: He exhibits tenderness.       Back:  Patient is tender in the right paraspinous muscles of his lower lumbar spine.  He cannot do range of motion of the lumbar spine due to severe pain.  He has positive straight leg raising on the right.  He has 2+ patellar reflexes on the left, 0 on the right.  I am not sure if that chronic or new.  Neurological: He is alert and oriented to person, place, and time. No cranial nerve deficit.  Skin: Skin is warm and dry. No rash noted.  Psychiatric: He has a normal mood and affect. His behavior is normal. Thought content normal.  Nursing note and vitals reviewed.    ED Treatments / Results  Labs (all labs ordered are listed, but only abnormal results are displayed) Labs Reviewed - No data to display  EKG None  Radiology No results found.  Procedures Procedures (including critical care time)  Medications Ordered in ED Medications  dexamethasone (DECADRON) injection 10 mg (has no administration in time range)  diazepam (VALIUM) tablet 10 mg (has no administration in time range)  ketorolac (TORADOL) 30 MG/ML injection 30 mg (has no administration in time range)     Initial Impression / Assessment and Plan / ED Course  I have reviewed the triage vital signs and the nursing notes.  Pertinent labs & imaging results that were available during my care of the patient were reviewed by me and considered in my medical decision making (see chart for details).     Patient states he was started on gabapentin about 1 to 2 months ago and he tries to take it at least  once a day.  He also states he takes naproxen and trazodone.  As far as his OxyContin he states he does not take it every day.  He states he has "a few pills left" when I asked him about it.  However when I look at the database he just got #120 oxycodone 10 mg tablets filled on June 6.  He states "they do not work anyway".  Review of the [place] database shows patient was getting #150 oxycodone  15 mg tablets monthly through April.  These are prescribed by his PCP.  Starting in May he was dropped down to #120 oxycodone 10 mg tablets monthly by his PCP.  Patient was given a dose of steroids, anti-inflammatory, and oral Valium since there is a Scientist, clinical (histocompatibility and immunogenetics) of parenteral Valium.  He was referred back to his PCP.  He has no red flags as far as his acute flareup of his chronic pain.  Final Clinical Impressions(s) / ED Diagnoses   Final diagnoses:  Right-sided low back pain with right-sided sciatica, unspecified chronicity  Muscle spasm of back    ED Discharge Orders        Ordered    cyclobenzaprine (FLEXERIL) 10 MG tablet  3 times daily PRN     01/13/18 0000    predniSONE (DELTASONE) 20 MG tablet     01/13/18 0000    Naproxen  Plan discharge  Devoria Albe, MD, Concha Pyo, MD 01/13/18 0002

## 2018-01-12 NOTE — ED Triage Notes (Signed)
Pt arrives via POV from home. Pt presents with severe lumbar back pain reporting it began this past Saturday and has been progressively worsening. Pt reports its painful to walk and do daily activities at home.

## 2018-01-12 NOTE — Discharge Instructions (Addendum)
Use ice and heat for comfort. Continue your naproxen. Take the medications as prescribed. Follow up with Dr Janna Archondiego if not improving.

## 2018-01-13 MED ORDER — PREDNISONE 20 MG PO TABS: ORAL_TABLET | ORAL | 0 refills | Status: AC

## 2018-01-13 MED ORDER — CYCLOBENZAPRINE HCL 10 MG PO TABS: 10.0000 mg | ORAL_TABLET | Freq: Three times a day (TID) | ORAL | 0 refills | Status: AC | PRN

## 2018-02-02 ENCOUNTER — Other Ambulatory Visit (HOSPITAL_COMMUNITY): Payer: Self-pay | Admitting: Family Medicine

## 2018-02-02 DIAGNOSIS — M545 Low back pain: Secondary | ICD-10-CM

## 2018-02-18 ENCOUNTER — Ambulatory Visit (HOSPITAL_COMMUNITY)
Admission: RE | Admit: 2018-02-18 | Discharge: 2018-02-18 | Disposition: A | Discharge status: Home / Self Care | Payer: Medicare HMO | Source: Ambulatory Visit | Attending: Family Medicine | Admitting: Family Medicine

## 2018-02-18 DIAGNOSIS — M545 Low back pain: Secondary | ICD-10-CM | POA: Diagnosis not present

## 2018-02-18 DIAGNOSIS — M48061 Spinal stenosis, lumbar region without neurogenic claudication: Secondary | ICD-10-CM | POA: Diagnosis not present

## 2018-02-18 DIAGNOSIS — I700 Atherosclerosis of aorta: Secondary | ICD-10-CM | POA: Insufficient documentation

## 2018-02-18 DIAGNOSIS — M4327 Fusion of spine, lumbosacral region: Secondary | ICD-10-CM | POA: Diagnosis not present

## 2018-02-18 MED ORDER — IOPAMIDOL (ISOVUE-300) INJECTION 61%
100.0000 mL | Freq: Once | INTRAVENOUS | Status: AC | PRN
  Administered 2018-02-18: 100 mL via INTRAVENOUS

## 2018-03-28 ENCOUNTER — Telehealth: Payer: Self-pay | Admitting: Nurse Practitioner

## 2018-03-28 ENCOUNTER — Other Ambulatory Visit: Payer: Self-pay | Admitting: Physician Assistant

## 2018-03-28 DIAGNOSIS — M48062 Spinal stenosis, lumbar region with neurogenic claudication: Secondary | ICD-10-CM

## 2018-03-28 NOTE — Telephone Encounter (Signed)
Phone call to patient to verify medication list and allergies for myelogram procedure. Pt instructed to hold trazodone 48hr prior to appointment time. Pt verbalized understanding.

## 2018-04-12 ENCOUNTER — Ambulatory Visit
Admission: RE | Admit: 2018-04-12 | Discharge: 2018-04-12 | Disposition: A | Discharge status: Home / Self Care | Payer: Medicare HMO | Source: Ambulatory Visit | Attending: Physician Assistant | Admitting: Physician Assistant

## 2018-04-12 ENCOUNTER — Ambulatory Visit
Admission: RE | Admit: 2018-04-12 | Discharge: 2018-04-12 | Disposition: A | Discharge status: Home / Self Care | Payer: Medicaid Other | Source: Ambulatory Visit | Attending: Physician Assistant | Admitting: Physician Assistant

## 2018-04-12 DIAGNOSIS — M48062 Spinal stenosis, lumbar region with neurogenic claudication: Secondary | ICD-10-CM

## 2018-04-12 MED ORDER — IOPAMIDOL (ISOVUE-M 200) INJECTION 41%
15.0000 mL | Freq: Once | INTRAMUSCULAR | Status: AC
  Administered 2018-04-12: 15 mL via INTRATHECAL

## 2018-04-12 MED ORDER — ONDANSETRON HCL 4 MG/2ML IJ SOLN: 4.0000 mg | Freq: Four times a day (QID) | INTRAMUSCULAR | Status: DC | PRN

## 2018-04-12 MED ORDER — DIAZEPAM 5 MG PO TABS
10.0000 mg | ORAL_TABLET | Freq: Once | ORAL | Status: AC
  Administered 2018-04-12: 10 mg via ORAL

## 2018-04-12 NOTE — Discharge Instructions (Signed)
Myelogram Discharge Instructions  1. Go home and rest quietly for the next 24 hours.  It is important to lie flat for the next 24 hours.  Get up only to go to the restroom.  You may lie in the bed or on a couch on your back, your stomach, your left side or your right side.  You may have one pillow under your head.  You may have pillows between your knees while you are on your side or under your knees while you are on your back.  2. DO NOT drive today.  Recline the seat as far back as it will go, while still wearing your seat belt, on the way home.  3. You may get up to go to the bathroom as needed.  You may sit up for 10 minutes to eat.  You may resume your normal diet and medications unless otherwise indicated.  Drink lots of extra fluids today and tomorrow.  4. The incidence of headache, nausea, or vomiting is about 5% (one in 20 patients).  If you develop a headache, lie flat and drink plenty of fluids until the headache goes away.  Caffeinated beverages may be helpful.  If you develop severe nausea and vomiting or a headache that does not go away with flat bed rest, call [phone].  5. You may resume normal activities after your 24 hours of bed rest is over; however, do not exert yourself strongly or do any heavy lifting tomorrow. If when you get up you have a headache when standing, go back to bed and force fluids for another 24 hours.  6. Call your physician for a follow-up appointment.  The results of your myelogram will be sent directly to your physician by the following day.  7. If you have any questions or if complications develop after you arrive home, please call [phone].  Discharge instructions have been explained to the patient.  The patient, or the person responsible for the patient, fully understands these instructions.  YOU MAY RESTART YOUR TRAZODONE TOMORROW 04/13/2018 AT 09:30AM.

## 2018-10-08 ENCOUNTER — Encounter (HOSPITAL_COMMUNITY): Payer: Self-pay | Admitting: Emergency Medicine

## 2018-10-08 ENCOUNTER — Emergency Department (HOSPITAL_COMMUNITY)
Admission: EM | Admit: 2018-10-08 | Discharge: 2018-10-08 | Disposition: A | Discharge status: Home / Self Care | Payer: Medicare HMO | Attending: Emergency Medicine | Admitting: Emergency Medicine

## 2018-10-08 ENCOUNTER — Emergency Department (HOSPITAL_COMMUNITY): Payer: Medicare HMO

## 2018-10-08 ENCOUNTER — Other Ambulatory Visit: Payer: Self-pay

## 2018-10-08 DIAGNOSIS — F17210 Nicotine dependence, cigarettes, uncomplicated: Secondary | ICD-10-CM | POA: Diagnosis not present

## 2018-10-08 DIAGNOSIS — J189 Pneumonia, unspecified organism: Secondary | ICD-10-CM | POA: Insufficient documentation

## 2018-10-08 DIAGNOSIS — R05 Cough: Secondary | ICD-10-CM | POA: Diagnosis present

## 2018-10-08 DIAGNOSIS — Z79899 Other long term (current) drug therapy: Secondary | ICD-10-CM | POA: Insufficient documentation

## 2018-10-08 MED ORDER — AZITHROMYCIN 250 MG PO TABS: 250.0000 mg | ORAL_TABLET | Freq: Every day | ORAL | 0 refills | Status: AC

## 2018-10-08 MED ORDER — IBUPROFEN 400 MG PO TABS
600.0000 mg | ORAL_TABLET | Freq: Once | ORAL | Status: AC
  Administered 2018-10-08: 600 mg via ORAL
  Filled 2018-10-08: qty 2

## 2018-10-08 MED ORDER — DEXAMETHASONE 4 MG PO TABS
8.0000 mg | ORAL_TABLET | Freq: Once | ORAL | Status: AC
  Administered 2018-10-08: 8 mg via ORAL
  Filled 2018-10-08: qty 2

## 2018-10-08 MED ORDER — OXYCODONE-ACETAMINOPHEN 5-325 MG PO TABS
1.0000 | ORAL_TABLET | Freq: Once | ORAL | Status: AC
  Administered 2018-10-08: 1 via ORAL
  Filled 2018-10-08: qty 1

## 2018-10-08 MED ORDER — AMOXICILLIN 500 MG PO CAPS: 500.0000 mg | ORAL_CAPSULE | Freq: Three times a day (TID) | ORAL | 0 refills | Status: AC

## 2018-10-08 NOTE — ED Triage Notes (Signed)
Pt reports he has had an ongoing productive cough "for a while now" but it just got worse over the past few days.  Went to pcp on Thursday and forgot to mention it so figures it has worsened since then.

## 2018-10-08 NOTE — ED Provider Notes (Signed)
[facility] EMERGENCY DEPARTMENT Provider Note   CSN: [id number] Arrival date & time: 10/08/18  7588    History   Chief Complaint Chief Complaint  Patient presents with  . Cough    HPI Hector [name] is a 53 y.o. male.     HPI   53 year old male with cough and left-sided chest pain.  He states that he has had a cough for quite some time but is been worse in the past several days he is now also having sharp chest pain.  The pain is in the left back and chest.  Worse with coughing and deep breaths and sometimes certain meds.  He has not felt like he has had a fever.  Mild shortness of breath.  No nausea or vomiting.  No unusual leg pain or swelling.  No orthopnea.  Past Medical History:  Diagnosis Date  . Degenerative disc disease   . Hepatitis C     Patient Active Problem List   Diagnosis Date Noted  . Hepatitis C 09/28/2014  . Chronic back pain 09/28/2014    Past Surgical History:  Procedure Laterality Date  . BACK SURGERY    . SPINAL FUSION          Home Medications    Prior to Admission medications   Medication Sig Start Date End Date Taking? Authorizing Provider  acetaminophen (TYLENOL) 500 MG tablet  11/23/17   [provider]  amoxicillin (AMOXIL) 500 MG capsule Take 1 capsule (500 mg total) by mouth 3 (three) times daily. 10/08/18   Raeford Razor, MD  amoxicillin-clavulanate (AUGMENTIN) 875-125 MG tablet Take 1 tablet by mouth every 12 (twelve) hours. Patient not taking: Reported on 03/28/2018 12/05/17   Emi Holes, PA-C  azithromycin (ZITHROMAX) 250 MG tablet Take 1 tablet (250 mg total) by mouth daily. Take first 2 tablets together, then 1 every day until finished. 10/08/18   Raeford Razor, MD  cyclobenzaprine (FLEXERIL) 10 MG tablet Take 1 tablet (10 mg total) by mouth 3 (three) times daily as needed for muscle spasms. Patient not taking: Reported on 03/28/2018 01/13/18   Devoria Albe, MD  gabapentin (NEURONTIN) 300 MG capsule  01/06/18   [provider]  ibuprofen (ADVIL,MOTRIN) 600 MG tablet Take 1 tablet (600 mg total) by mouth every 6 (six) hours as needed. 03/11/14   Burgess Amor, PA-C  Ledipasvir-Sofosbuvir 90-400 MG TABS Take by mouth.    [provider]  naproxen (NAPROSYN) 500 MG tablet  01/11/18   [provider]  OxyCODONE (OXYCONTIN) 10 mg T12A 12 hr tablet Take 10 mg by mouth 4 (four) times daily.    [provider]  Oxycodone HCl 10 MG TABS  01/06/18   [provider]  oxyCODONE-acetaminophen (PERCOCET/ROXICET) 5-325 MG per tablet Take 1 tablet by mouth every 4 (four) hours as needed. Patient not taking: Reported on 03/28/2018 03/11/14   Burgess Amor, PA-C  predniSONE (DELTASONE) 20 MG tablet Take 3 po QD x 3d , then 2 po QD x 3d then 1 po QD x 3d 01/13/18   Devoria Albe, MD  traZODone (DESYREL) 50 MG tablet  01/06/18   [provider]  VENTOLIN HFA 108 [phone] Base) MCG/ACT inhaler  12/25/17   [provider]  vitamin B-12 (CYANOCOBALAMIN) 100 MCG tablet  11/23/17   [provider]  Vitamins A & D 5000-400 units CAPS  11/23/17   [provider]    Family History Family History  Problem Relation Age of Onset  .  Diabetes Mother   . Diabetes Father     Social History Social History   Tobacco Use  . Smoking status: Current Every Day Smoker    Packs/day: 0.50    Types: Cigarettes  . Smokeless tobacco: Never Used  . Tobacco comment: 1/2-3/4 pack a day x 30 yrs  Substance Use Topics  . Alcohol use: No    Alcohol/week: 0.0 standard drinks  . Drug use: No     Allergies   Hydrocodone   Review of Systems Review of Systems  All systems reviewed and negative, other than as noted in HPI. Physical Exam Updated Vital Signs BP 131/75 (BP Location: Right Arm)   Pulse 88   Temp 97.8 F (36.6 C) (Oral)   Resp (!) 22   Ht 5\' 9"  (1.753 m)   Wt 99.8 kg   SpO2 95%   BMI 32.49 kg/m   Physical Exam Vitals signs and nursing note reviewed.    Constitutional:      General: He is not in acute distress.    Appearance: He is well-developed.  HENT:     Head: Normocephalic and atraumatic.  Eyes:     General:        Right eye: No discharge.        Left eye: No discharge.     Conjunctiva/sclera: Conjunctivae normal.  Neck:     Musculoskeletal: Neck supple.  Cardiovascular:     Rate and Rhythm: Normal rate and regular rhythm.     Heart sounds: Normal heart sounds. No murmur. No friction rub. No gallop.   Pulmonary:     Effort: Pulmonary effort is normal. No respiratory distress.     Breath sounds: Normal breath sounds.  Abdominal:     General: There is no distension.     Palpations: Abdomen is soft.     Tenderness: There is no abdominal tenderness.  Musculoskeletal:        General: No tenderness.     Comments: Lower extremities symmetric as compared to each other. No calf tenderness. Negative Homan's. No palpable cords.   Skin:    General: Skin is warm and dry.  Neurological:     Mental Status: He is alert.  Psychiatric:        Behavior: Behavior normal.        Thought Content: Thought content normal.      ED Treatments / Results  Labs (all labs ordered are listed, but only abnormal results are displayed) Labs Reviewed - No data to display  EKG None  Radiology Dg Chest 2 View  Result Date: 10/08/2018 CLINICAL DATA:  Cough EXAM: CHEST - 2 VIEW COMPARISON:  None. FINDINGS: The heart size and mediastinal contours are within normal limits. There may be subtle heterogeneous opacity of the lingula without obvious focal consolidation. The visualized skeletal structures are unremarkable. IMPRESSION: There may be subtle heterogeneous opacity of the lingula without obvious focal consolidation. In the setting of suspected infection consider follow-up radiographs in 4-6 weeks to document resolution. Electronically Signed   By: Lauralyn PrimesAlex  Bibbey M.D.   On: 10/08/2018 11:19    Procedures Procedures (including critical care  time)  Medications Ordered in ED Medications  ibuprofen (ADVIL,MOTRIN) tablet 600 mg (600 mg Oral Given 10/08/18 1108)  dexamethasone (DECADRON) tablet 8 mg (8 mg Oral Given 10/08/18 1108)  oxyCODONE-acetaminophen (PERCOCET/ROXICET) 5-325 MG per tablet 1 tablet (1 tablet Oral Given 10/08/18 1108)     Initial Impression / Assessment and Plan / ED Course  I  have reviewed the triage vital signs and the nursing notes.  Pertinent labs & imaging results that were available during my care of the patient were reviewed by me and considered in my medical decision making (see chart for details).  53 year old male with pleuritic sounding chest pain and productive cough.  Imaging as above.  Does correlate to the area of his symptoms.  He is afebrile here.  Oxygen saturations are adequate on room air.  He is nontoxic.  Hemodynamically stable.  I feel he is appropriate for outpatient treatment of community-acquired pneumonia.  Antibiotics prescribed.  Return precautions were discussed.  Outpatient follow-up otherwise.  Final Clinical Impressions(s) / ED Diagnoses   Final diagnoses:  Community acquired pneumonia of left lung, unspecified part of lung    ED Discharge Orders         Ordered    amoxicillin (AMOXIL) 500 MG capsule  3 times daily     10/08/18 1124    azithromycin (ZITHROMAX) 250 MG tablet  Daily     10/08/18 1124           Raeford Razor, MD 10/08/18 1127
